# Patient Record
Sex: Female | Born: 1970
Health system: Southern US, Community
[De-identification: ages and names within clinical notes are randomized; demographics above are authoritative.]

## PROBLEM LIST (undated history)

## (undated) DIAGNOSIS — Z8041 Family history of malignant neoplasm of ovary: Secondary | ICD-10-CM

## (undated) DIAGNOSIS — T7840XA Allergy, unspecified, initial encounter: Secondary | ICD-10-CM

## (undated) DIAGNOSIS — E079 Disorder of thyroid, unspecified: Secondary | ICD-10-CM

## (undated) DIAGNOSIS — Z803 Family history of malignant neoplasm of breast: Secondary | ICD-10-CM

## (undated) HISTORY — DX: Disorder of thyroid, unspecified: E07.9

## (undated) HISTORY — DX: Allergy, unspecified, initial encounter: T78.40XA

## (undated) HISTORY — DX: Family history of malignant neoplasm of ovary: Z80.41

## (undated) HISTORY — DX: Family history of malignant neoplasm of breast: Z80.3

---

## 2004-06-18 ENCOUNTER — Encounter: Admission: RE | Admit: 2004-06-18 | Discharge: 2004-06-18 | Payer: Self-pay | Admitting: Obstetrics and Gynecology

## 2005-10-18 ENCOUNTER — Other Ambulatory Visit: Admission: RE | Admit: 2005-10-18 | Discharge: 2005-10-18 | Payer: Self-pay | Admitting: Family Medicine

## 2006-04-12 HISTORY — PX: LIPOMA EXCISION: SHX5283

## 2006-11-07 ENCOUNTER — Other Ambulatory Visit: Admission: RE | Admit: 2006-11-07 | Discharge: 2006-11-07 | Payer: Self-pay | Admitting: Family Medicine

## 2008-02-22 ENCOUNTER — Other Ambulatory Visit: Admission: RE | Admit: 2008-02-22 | Discharge: 2008-02-22 | Payer: Self-pay | Admitting: Family Medicine

## 2009-03-05 ENCOUNTER — Other Ambulatory Visit: Admission: RE | Admit: 2009-03-05 | Discharge: 2009-03-05 | Payer: Self-pay | Admitting: Family Medicine

## 2010-06-11 ENCOUNTER — Ambulatory Visit (INDEPENDENT_AMBULATORY_CARE_PROVIDER_SITE_OTHER): Payer: BC Managed Care – PPO | Admitting: Family Medicine

## 2010-06-11 ENCOUNTER — Other Ambulatory Visit (HOSPITAL_COMMUNITY)
Admission: RE | Admit: 2010-06-11 | Discharge: 2010-06-11 | Disposition: A | Discharge status: Home / Self Care | Payer: BC Managed Care – PPO | Source: Ambulatory Visit | Attending: Family Medicine | Admitting: Family Medicine

## 2010-06-11 ENCOUNTER — Other Ambulatory Visit: Payer: Self-pay | Admitting: Family Medicine

## 2010-06-11 DIAGNOSIS — Z124 Encounter for screening for malignant neoplasm of cervix: Secondary | ICD-10-CM | POA: Insufficient documentation

## 2010-06-11 DIAGNOSIS — E559 Vitamin D deficiency, unspecified: Secondary | ICD-10-CM

## 2010-06-11 DIAGNOSIS — Z0000 Encounter for general adult medical examination without abnormal findings: Secondary | ICD-10-CM

## 2010-06-11 DIAGNOSIS — E039 Hypothyroidism, unspecified: Secondary | ICD-10-CM

## 2010-06-11 DIAGNOSIS — Z01419 Encounter for gynecological examination (general) (routine) without abnormal findings: Secondary | ICD-10-CM

## 2010-06-12 LAB — HM PAP SMEAR: HM Pap smear: NEGATIVE

## 2010-11-11 ENCOUNTER — Encounter: Payer: Self-pay | Admitting: Family Medicine

## 2010-11-12 ENCOUNTER — Ambulatory Visit: Payer: BC Managed Care – PPO | Admitting: Family Medicine

## 2010-11-19 ENCOUNTER — Ambulatory Visit: Payer: BC Managed Care – PPO | Admitting: Family Medicine

## 2010-12-31 ENCOUNTER — Other Ambulatory Visit (INDEPENDENT_AMBULATORY_CARE_PROVIDER_SITE_OTHER): Payer: BC Managed Care – PPO

## 2011-01-01 ENCOUNTER — Encounter: Payer: Self-pay | Admitting: Family Medicine

## 2011-01-01 ENCOUNTER — Telehealth: Payer: Self-pay | Admitting: *Deleted

## 2011-01-01 DIAGNOSIS — E559 Vitamin D deficiency, unspecified: Secondary | ICD-10-CM | POA: Insufficient documentation

## 2011-01-01 LAB — VITAMIN D 25 HYDROXY (VIT D DEFICIENCY, FRACTURES): Vit D, 25-Hydroxy: 19 ng/mL — ABNORMAL LOW (ref 30–89)

## 2011-01-01 NOTE — Telephone Encounter (Signed)
Left message for pt to return my call to go over labs.

## 2011-01-05 ENCOUNTER — Other Ambulatory Visit: Payer: Self-pay | Admitting: *Deleted

## 2011-01-05 ENCOUNTER — Telehealth: Payer: Self-pay | Admitting: *Deleted

## 2011-01-05 DIAGNOSIS — E559 Vitamin D deficiency, unspecified: Secondary | ICD-10-CM

## 2011-01-05 MED ORDER — VITAMIN D (ERGOCALCIFEROL) 1.25 MG (50000 UNIT) PO CAPS: 50000.0000 [IU] | ORAL_CAPSULE | ORAL | Status: DC

## 2011-01-05 NOTE — Telephone Encounter (Signed)
Spoke with patient she will take vitamin d 50,000iu q weekly for 12 weeks, followed by OTC vitamin d 2,000iu. She is scheduled for 05/14/11 @ 2:00pm for vitamin d level. Called in rx to [facility] [address]

## 2011-05-11 ENCOUNTER — Encounter: Payer: Self-pay | Admitting: Internal Medicine

## 2011-05-11 ENCOUNTER — Other Ambulatory Visit: Payer: Self-pay | Admitting: Family Medicine

## 2011-05-11 DIAGNOSIS — Z1231 Encounter for screening mammogram for malignant neoplasm of breast: Secondary | ICD-10-CM

## 2011-05-14 ENCOUNTER — Other Ambulatory Visit: Payer: BC Managed Care – PPO

## 2011-05-14 DIAGNOSIS — E559 Vitamin D deficiency, unspecified: Secondary | ICD-10-CM

## 2011-05-15 LAB — VITAMIN D 25 HYDROXY (VIT D DEFICIENCY, FRACTURES): Vit D, 25-Hydroxy: 21 ng/mL — ABNORMAL LOW (ref 30–89)

## 2011-06-15 ENCOUNTER — Ambulatory Visit
Admission: RE | Admit: 2011-06-15 | Discharge: 2011-06-15 | Disposition: A | Discharge status: Home / Self Care | Payer: BC Managed Care – PPO | Source: Ambulatory Visit | Attending: Family Medicine | Admitting: Family Medicine

## 2011-06-15 DIAGNOSIS — Z1231 Encounter for screening mammogram for malignant neoplasm of breast: Secondary | ICD-10-CM

## 2011-12-20 ENCOUNTER — Encounter: Payer: Self-pay | Admitting: Family Medicine

## 2011-12-20 ENCOUNTER — Ambulatory Visit (INDEPENDENT_AMBULATORY_CARE_PROVIDER_SITE_OTHER): Payer: BC Managed Care – PPO | Admitting: Family Medicine

## 2011-12-20 VITALS — BP 116/72 | HR 68 | Ht 61.0 in | Wt 129.0 lb

## 2011-12-20 DIAGNOSIS — M79605 Pain in left leg: Secondary | ICD-10-CM

## 2011-12-20 DIAGNOSIS — M79609 Pain in unspecified limb: Secondary | ICD-10-CM

## 2011-12-20 DIAGNOSIS — E559 Vitamin D deficiency, unspecified: Secondary | ICD-10-CM

## 2011-12-20 DIAGNOSIS — M25539 Pain in unspecified wrist: Secondary | ICD-10-CM

## 2011-12-20 DIAGNOSIS — M25531 Pain in right wrist: Secondary | ICD-10-CM

## 2011-12-20 MED ORDER — NAPROXEN 500 MG PO TABS: 500.0000 mg | ORAL_TABLET | Freq: Two times a day (BID) | ORAL | Status: DC

## 2011-12-20 NOTE — Progress Notes (Signed)
Chief Complaint  Patient presents with  . Leg Swelling    left leg swelling and pain, especially in the shin area. Pt declines flu vaccine.   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  Pain also radiates down the shin.  Hurts to sleep at night due to pain.  Denies numbness or tingling.  Pain is described as sharp and shooting, not a burning pain.  Aches and throbs at night, keeps her awake.  Sometimes she sees a bulging vein that is very tender.  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  There is tenderness to touch.  Denies injury, change in activity.  Hit her leg with edge of car door over the summer.  Had a dark area to medial calf for 2-3 months.  Massage helped it.  This current pain seems higher up, unrelated.  Has taken advil and uses ace wrap for compression. Compression helps temporarily.  Worse over the last week.    Didn't have the swelling over the summer, when she wasn't working.  Swelling seems to occur when she is working, possibly positional.  Sometimes she sits on top of her leg; tries to change position frequently.  Shins hurt after walking.  Using orthotic which helps a little bit.  She also is complaining of R anterior wrist pain.  She rests the wrist on the laptop.  She doesn't keep wrists elevated while typing, hers rest on the laptop.  She isn't sure if it could be related to the heat from the laptop, or the position.  Denies numbness/tingling or weakness into R hand or fingers.    Vitamin D deficiency--she completed Rx, but stopped taking OTC Vitamin D in June.  Feels like she is eating better.  No current outpatient prescriptions on file prior to visit.   No Known Allergies  ROS:  Denies fevers, headaches, dizziness, chest pain, shortness of breath, cough, URI symptoms, skin rashes, depression, other joint pains, GI complaints or other concerns. +7 pound weight  gain  PHYSICAL EXAM: BP 116/72  Pulse 68  Ht 5\' 1"  (1.549 m)  Wt 129 lb (58.514 kg)  BMI 24.37 kg/m2  LMP 11/18/2011 Well developed, pleasant female in no distress Wrist: Mild +Tinel on right  Negative phalen.  Normal strength and sensation.  LLE:  Mild tenderness at inferior portion of anserine bursa only.  Small portion of vein is mildly tender.  No significant knot or distended vein visible.  No edema.  Megan Torres is nontender. Negative homan.  No calf swelling or tenderness.  2+ pulses.  Normal strength, sensation, reflexes.  ASSESSMENT/PLAN: 1. Leg pain, left   2. Unspecified vitamin D deficiency   3. Wrist pain, right    Possible symptomatic varicose vein vs mild superficial thrombophlebitis vs mild tendinitis.  Also has component of shin splints.  Sounds like she had hematoma from injury over the summer, which has resolved.  Mild tendinitis vs CTS on right.  Discussed compression stockings (recommend thigh high due to proximal location of symptoms in lower leg). Trial of NSAIDs.  Stop Advil.  Rx naproxen 500mg  BID x 10-14 days.  Risks/side effects reviewed. Take it with food, and cut back on the dose (1/2 tablet) if stomach is bothered. Use heat to affected areas of leg and wrist. Keep leg elevated when possible when having swelling and pain.   Recommended wrist brace (OTC)  Vitamin D deficiency--recommended restarting Vitamin  D 1000 IU daily.  Recheck at CPE.   Given weight gain, will also check TSH at CPE, unless recently checked by Dr. Lucianne Muss.  Will need TdaP at her CPE (last received when immigrated in 2003)

## 2011-12-20 NOTE — Patient Instructions (Addendum)
compression stockings (recommend thigh high due to proximal location of symptoms in lower leg). You can get these at medical supply store [employer] is on [address] near [place]; there is another store on [address] near [employer]).   Stop Advil.  Take naproxen 500mg  twice daily with food x 10-14 days Take it with food, and cut back on the dose (1/2 tablet) if stomach is bothered. Use heat to affected areas of leg and wrist. Consider wearing wrist brace (available at all pharmacies, for carpal tunnel) while working on the computer.  Keep leg elevated when possible when having swelling and pain.    Return for re-evaluation in 2 weeks if not improved.  Restart Vitamin D 1000 IU daily.  We will plan to recheck level at your physical.

## 2011-12-22 ENCOUNTER — Encounter: Payer: Self-pay | Admitting: Family Medicine

## 2012-02-09 ENCOUNTER — Encounter: Payer: BC Managed Care – PPO | Admitting: Family Medicine

## 2012-04-13 ENCOUNTER — Other Ambulatory Visit: Payer: BC Managed Care – PPO

## 2012-04-17 ENCOUNTER — Encounter: Payer: BC Managed Care – PPO | Admitting: Family Medicine

## 2012-04-20 ENCOUNTER — Encounter: Payer: Self-pay | Admitting: Family Medicine

## 2012-04-20 ENCOUNTER — Ambulatory Visit (INDEPENDENT_AMBULATORY_CARE_PROVIDER_SITE_OTHER): Payer: BC Managed Care – PPO | Admitting: Family Medicine

## 2012-04-20 VITALS — BP 120/78 | HR 72 | Ht 60.0 in | Wt 132.0 lb

## 2012-04-20 DIAGNOSIS — M25569 Pain in unspecified knee: Secondary | ICD-10-CM

## 2012-04-20 DIAGNOSIS — E039 Hypothyroidism, unspecified: Secondary | ICD-10-CM

## 2012-04-20 DIAGNOSIS — E559 Vitamin D deficiency, unspecified: Secondary | ICD-10-CM

## 2012-04-20 DIAGNOSIS — M25539 Pain in unspecified wrist: Secondary | ICD-10-CM

## 2012-04-20 DIAGNOSIS — M25531 Pain in right wrist: Secondary | ICD-10-CM

## 2012-04-20 DIAGNOSIS — M25562 Pain in left knee: Secondary | ICD-10-CM

## 2012-04-20 LAB — TSH: TSH: 3.092 u[IU]/mL (ref 0.350–4.500)

## 2012-04-20 MED ORDER — CELECOXIB 200 MG PO CAPS: 200.0000 mg | ORAL_CAPSULE | Freq: Every day | ORAL | Status: DC

## 2012-04-20 NOTE — Patient Instructions (Addendum)
Try and use the wrist brace when pain is flaring, wrist is swelling. Take celebrex once daily.  If you don't tolerate this medication, then you can try taking just 1/2 the naproxen twice daily with food (med that was previously prescribed--you might tolerate it better at lower dose).  If you have ongoing problems with wrist pain, we can check an x-ray, and refer you do hand doctor.  Try using moist heat to area of pain at lower leg, and ice and/or heat to knee (apply ice if swollen after exercise, otherwise heat can help).

## 2012-04-20 NOTE — Progress Notes (Signed)
Chief Complaint  Patient presents with  . Wrist Pain    right wrist pain and left knee pain x 6 months. Would like you to check her thyroid, she states she has a pain in the right side of her neck.   R wrist pain.  She notices a swelling/lump, that periodically hurts.  It is less painful now than when she originally called.  She used a wrist brace, and pain improves when she uses it.  The area swells when she uses it a lot. Pain radiates up the whole arm when she is using it a lot. Previously evaluated by me for this in September, and felt to have CTS vs tendinitis (didn't have this focal tenderness on dorsum of wrist at that time though)..  rx'd naprosyn, which bothered her stomach, so she stopped it.  L knee pain.  Pain at her left inferomedial knee area when she is sitting. Pain also radiates down the shin. Hurts to sleep at night sometimes, due to pain. Denies numbness or tingling. Pain is described as sharp and shooting, not a burning pain. Aches and throbs at night, keeps her awake.   Painful vein in lower left leg--not sure if she could have had any trauma to the area, but has had ongoing pain over that vein for 2 weeks.  Started around the same time she noticed the inferomedial knee area swelling again.  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  She is exercising on bike and doing yoga regularly.  Vitamin D deficiency and hypothyroidism--she wants vitamin D re-checked, and would like her thyroid checked with blood draw.  She is normally followed by Dr. Lucianne Muss.  Has noticed some increase in her weight, and some right sided anterior neck pain. Her vitamin D-OH was low at 21 back in 05/2011.  Has been taking supplement regularly since September.  Past Medical History  Diagnosis Date  . Vitamin D deficiency 2010  . Family history of breast cancer in mother   . Thyroid disease     HYPOTHYROID, h/o hyperthyroidism.  off meds  since 8/09  (Dr. Lucianne Muss)   Past Surgical History  Procedure Date  . Lipoma excision 1/08    fatty tumor removed from L axilla (in [place])   History   Social History  . Marital Status: Unknown    Spouse Name: N/A    Number of Children: N/A  . Years of Education: N/A   Occupational History  . Not on file.   Social History Main Topics  . Smoking status: Never Smoker   . Smokeless tobacco: Not on file  . Alcohol Use: Yes     Comment: 1/2-1 glass of wine every 3 months.  . Drug Use: No  . Sexually Active: Not on file   Other Topics Concern  . Not on file   Social History Narrative   Immigrated from [place] in 2003. Divorced and re-married.  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   No Known Allergies  ROS:  Denies fevers, headaches, dizziness, chest pain, shortness of breath, URI symptoms, nausea, vomiting, diarrhea, skin rash, or other concerns except as per HPI  PHYSICAL EXAM: BP 120/78  Pulse 72  Ht 5' (1.524 m)  Wt 132 lb (59.875 kg)  BMI 25.78 kg/m2  LMP 04/19/2012 Well developed, pleasant female in no distress R wrist--FROM, but painful.  Focal tender area on  dorsum of wrist over bone--not particularly swollen (symmetric with left).  No warmth or erythema.  Negative tinel and phalen testing.  Tender over L anserine bursa, minimal swelling, no warmth. No crepitus.  No pain with varus/valgus stress.  Normal lachman, mcmurray.  She has mild bulging and tenderness of vein in anterior lower shin area.  There is no thrombosis/cord and only mildly tender  ASSESSMENT/PLAN: 1. Wrist pain, right  celecoxib (CELEBREX) 200 MG capsule  2. Unspecified hypothyroidism  TSH  3. Unspecified vitamin D deficiency  Vitamin D 25 hydroxy  4. Left knee pain  celecoxib (CELEBREX) 200 MG capsule   R wrist pain.  It appears that the CTS has resolved (she made changes in positioning with typing).  Now has either tendinitis (due to  radiation of pain up the arm) vs ganglion cyst.  Discussed differential of both, and treatments.  Recommended periodic use of wrist brace, and intermittent courses of NSAIDs.  Didn't tolerate naproxyn in past. Samples of Celebrex given.  If doesn't tolerate, can try 1/2 tablet of previously rx'd naproxen.  If persistent/ongoing pain/swelling, check xray and refer to Dr. Amanda Pea or Dr. Teressa Senter  Knee pain--likely related to tendinitis/bursitis, and celebrex should help.  Ice vs heat reviewed. Area of pain in lower shin--contusion vs mild superficial thrombophlebitis.  Celebrex and warm compresses recommended.  TSH and vitamin D-OH.  Send results to Dr. Lucianne Muss

## 2012-04-21 LAB — VITAMIN D 25 HYDROXY (VIT D DEFICIENCY, FRACTURES): Vit D, 25-Hydroxy: 22 ng/mL — ABNORMAL LOW (ref 30–89)

## 2012-04-24 ENCOUNTER — Other Ambulatory Visit: Payer: Self-pay | Admitting: *Deleted

## 2012-04-24 MED ORDER — ERGOCALCIFEROL 1.25 MG (50000 UT) PO CAPS: 50000.0000 [IU] | ORAL_CAPSULE | ORAL | Status: DC

## 2012-05-16 ENCOUNTER — Other Ambulatory Visit: Payer: Self-pay | Admitting: Family Medicine

## 2012-05-16 DIAGNOSIS — Z1231 Encounter for screening mammogram for malignant neoplasm of breast: Secondary | ICD-10-CM

## 2012-06-01 ENCOUNTER — Encounter: Payer: BC Managed Care – PPO | Admitting: Family Medicine

## 2012-06-08 ENCOUNTER — Ambulatory Visit (INDEPENDENT_AMBULATORY_CARE_PROVIDER_SITE_OTHER): Payer: BC Managed Care – PPO | Admitting: Family Medicine

## 2012-06-08 ENCOUNTER — Encounter: Payer: Self-pay | Admitting: Family Medicine

## 2012-06-08 VITALS — BP 110/70 | HR 68 | Ht 60.0 in | Wt 134.0 lb

## 2012-06-08 DIAGNOSIS — Z0000 Encounter for general adult medical examination without abnormal findings: Secondary | ICD-10-CM

## 2012-06-08 DIAGNOSIS — Z23 Encounter for immunization: Secondary | ICD-10-CM

## 2012-06-08 DIAGNOSIS — Z79899 Other long term (current) drug therapy: Secondary | ICD-10-CM

## 2012-06-08 DIAGNOSIS — E559 Vitamin D deficiency, unspecified: Secondary | ICD-10-CM

## 2012-06-08 DIAGNOSIS — E039 Hypothyroidism, unspecified: Secondary | ICD-10-CM

## 2012-06-08 LAB — POCT URINALYSIS DIPSTICK
Bilirubin, UA: NEGATIVE
Blood, UA: NEGATIVE
Glucose, UA: NEGATIVE
Ketones, UA: NEGATIVE
Leukocytes, UA: NEGATIVE
Nitrite, UA: NEGATIVE
Protein, UA: NEGATIVE
Spec Grav, UA: 1.01
Urobilinogen, UA: NEGATIVE
pH, UA: 6

## 2012-06-08 NOTE — Patient Instructions (Signed)

## 2012-06-08 NOTE — Progress Notes (Signed)
Chief Complaint  Patient presents with  . Annual Exam    nonfasting annual exam with pap. No major concerns. Is supposed to be taking rx vitamin D x 4 more weeks, she did not know that there was a refill, she will call and get refill. She is taking OTC vitamin 2,000iu.   Megan Torres is a 42 y.o. female who presents for a complete physical.  She has the following concerns:  No longer having any wrist pain (took just a few Celebrex tablet and it resolved).  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  Has been using a cycling machine at home which really seems to help keep the knee discomfort at bay.  Teaching 8 classes, working lots of hours, and not sleeping very much, and she feels like that is contributing to weight gain, as her eating hasn't changed.   Immunization History  Administered Date(s) Administered  . Influenza, Seasonal, Injecte, Preservative Fre 06/08/2012  last tetanus 2003 when she immigrated Last Pap smear: 2012 Last mammogram: 06/2011, scheduled for next week Last colonoscopy: never Last DEXA: never Dentist: every 3 months Ophtho: yearly Exercise:  Exercise bike daily 40-60 minutes, gym on weekends for 1.5 hours  Past Medical History  Diagnosis Date  . Vitamin D deficiency 2010  . Family history of breast cancer in mother   . Thyroid disease     HYPOTHYROID, h/o hyperthyroidism.  off meds since 8/09  (Dr. Lucianne Muss)    Past Surgical History  Procedure Laterality Date  . Lipoma excision  1/08    fatty tumor removed from L axilla (in [place])    History   Social History  . Marital Status: Unknown    Spouse Name: N/A    Number of Children: N/A  . Years of Education: N/A   Occupational History  . Not on file.   Social History Main Topics  . Smoking status: Never Smoker   . Smokeless tobacco: Never Used  . Alcohol Use: Yes     Comment: 1/2-1 glass of wine every 3 months.  . Drug Use: No  .  Sexually Active: Yes -- Female partner(s)    Birth Control/ Protection: Condom   Other Topics Concern  . Not on file   Social History Narrative   Immigrated from [place] in 2003. Divorced and re-married.  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 Breast cancer Mother 54  . Diabetes Neg Hx   . Heart disease Neg Hx     Current outpatient prescriptions:Cholecalciferol (VITAMIN D) 2000 UNITS tablet, Take 2,000 Units by mouth daily., Disp: , Rfl: ;  celecoxib (CELEBREX) 200 MG capsule, Take 1 capsule (200 mg total) by mouth daily., Disp: 12 capsule, Rfl: 0;  ergocalciferol (VITAMIN D2) 50000 UNITS capsule, Take 50,000 Units by mouth once a week., Disp: , Rfl:   No Known Allergies  ROS:  The patient denies anorexia, fever, headaches,  vision changes, decreased hearing, ear pain, sore throat, breast concerns, chest pain, palpitations, dizziness, syncope, dyspnea on exertion, cough, swelling, nausea, vomiting, diarrhea, constipation, abdominal pain, melena, hematochezia, indigestion/heartburn, hematuria, incontinence, dysuria, irregular menstrual cycles, vaginal discharge, odor or itch, genital lesions, joint pains, numbness, tingling, weakness, tremor, suspicious skin lesions, depression, anxiety, abnormal bleeding/bruising, or enlarged lymph nodes. +weight gain (13 pounds in 2 years, 5 pounds in the last 6 months).  She sees Dr. Lucianne Muss for her  thyroid, last TSH was 3 (done here and sent to him) Occasional L knee pain with certain positions. She is working a lot of ours, not sleeping very much  PHYSICAL EXAM: BP 110/70  Pulse 68  Ht 5' (1.524 m)  Wt 134 lb (60.782 kg)  BMI 26.17 kg/m2  LMP 05/20/2012  General Appearance:    Alert, cooperative, no distress, appears stated age  Head:    Normocephalic, without obvious abnormality, atraumatic  Eyes:    PERRL, conjunctiva/corneas clear, EOM's intact, fundi    benign  Ears:    Normal TM's and external ear  canals  Nose:   Nares normal, mucosa normal, no drainage or sinus   tenderness  Throat:   Lips, mucosa, and tongue normal; teeth and gums normal  Neck:   Supple, no lymphadenopathy;  thyroid:  no   enlargement/tenderness/nodules; no carotid   bruit or JVD  Back:    Spine nontender, no curvature, ROM normal, no CVA     tenderness  Lungs:     Clear to auscultation bilaterally without wheezes, rales or     ronchi; respirations unlabored  Chest Wall:    No tenderness or deformity   Heart:    Regular rate and rhythm, S1 and S2 normal, no murmur, rub   or gallop  Breast Exam:    No masses, or nipple discharge or inversion.      No axillary lymphadenopathy.  +breast tenderness laterally at L breast x few days, no focal masses   Abdomen:     Soft, non-tender, nondistended, normoactive bowel sounds,    no masses, no hepatosplenomegaly  Genitalia:    Normal external genitalia without lesions.  BUS and vagina normal--except pustule noted R labia.  No fluctuance or surrounding erythema; cervix without lesions, or cervical motion tenderness. No abnormal vaginal discharge.  Uterus and adnexa not enlarged, nontender, no masses.  Pap not performed  Rectal:    Not performed  Extremities:   No clubbing, cyanosis or edema  Pulses:   2+ and symmetric all extremities  Skin:   Skin color, texture, turgor normal, no rashes or lesions  Lymph nodes:   Cervical, supraclavicular, and axillary nodes normal  Neurologic:   CNII-XII intact, normal strength, sensation and gait; reflexes 2+ and symmetric throughout          Psych:   Normal mood, affect, hygiene and grooming.    ASSESSMENT/PLAN:  Routine general medical examination at a health care facility - Plan: Visual acuity screening, POCT Urinalysis Dipstick, CBC with Differential, Lipid panel, Comprehensive metabolic panel  Need for prophylactic vaccination and inoculation against influenza - Plan: Flu vaccine greater than or equal to 3yo preservative free  IM  Unspecified vitamin D deficiency - Plan: Vitamin D 25 hydroxy  Unspecified hypothyroidism  Encounter for long-term (current) use of other medications - Plan: CBC with Differential, Comprehensive metabolic panel, Vitamin D 25 hydroxy  Need for Tdap vaccination - Plan: Tdap vaccine greater than or equal to 7yo IM   Pap next year Warm compresses to small cyst R labia  Discussed monthly self breast exams and yearly mammograms after the age of 25; at least 30 minutes of aerobic activity at least 5 days/week; proper sunscreen use reviewed; healthy diet, including goals of calcium and vitamin D intake and alcohol recommendations (less than or equal to 1 drink/day) reviewed; regular seatbelt use; changing batteries in smoke detectors.  Immunization recommendations discussed.  Colonoscopy recommendations reviewed--age 29, sooner prn.  Tdap and flu shot  today  Return in 6 months for fasting labs: c-met, cbc, vitamin D-OH, lipid

## 2012-06-15 ENCOUNTER — Ambulatory Visit: Payer: BC Managed Care – PPO

## 2012-06-30 ENCOUNTER — Ambulatory Visit: Payer: BC Managed Care – PPO

## 2012-09-25 ENCOUNTER — Other Ambulatory Visit: Payer: Self-pay | Admitting: Family Medicine

## 2012-09-26 NOTE — Telephone Encounter (Signed)
This is NOT appropriate refill.  She was only supposed to take x 8 weeks, prescribed back in January.  Supposed to be taking 2000 IU of OTC Vitamin D daily, and is due for recheck in August

## 2012-09-26 NOTE — Telephone Encounter (Signed)
Is this ok?

## 2012-11-20 ENCOUNTER — Other Ambulatory Visit: Payer: BC Managed Care – PPO

## 2012-11-20 DIAGNOSIS — Z0000 Encounter for general adult medical examination without abnormal findings: Secondary | ICD-10-CM

## 2012-11-20 DIAGNOSIS — E559 Vitamin D deficiency, unspecified: Secondary | ICD-10-CM

## 2012-11-20 DIAGNOSIS — Z79899 Other long term (current) drug therapy: Secondary | ICD-10-CM

## 2012-11-20 LAB — CBC WITH DIFFERENTIAL/PLATELET
Basophils Absolute: 0 10*3/uL (ref 0.0–0.1)
Basophils Relative: 0 % (ref 0–1)
Eosinophils Absolute: 0.2 10*3/uL (ref 0.0–0.7)
Eosinophils Relative: 3 % (ref 0–5)
HCT: 36.4 % (ref 36.0–46.0)
Hemoglobin: 12.1 g/dL (ref 12.0–15.0)
Lymphocytes Relative: 29 % (ref 12–46)
Lymphs Abs: 1.9 10*3/uL (ref 0.7–4.0)
MCH: 28.1 pg (ref 26.0–34.0)
MCHC: 33.2 g/dL (ref 30.0–36.0)
MCV: 84.5 fL (ref 78.0–100.0)
Monocytes Absolute: 0.5 10*3/uL (ref 0.1–1.0)
Monocytes Relative: 8 % (ref 3–12)
Neutro Abs: 3.8 10*3/uL (ref 1.7–7.7)
Neutrophils Relative %: 60 % (ref 43–77)
Platelets: 280 10*3/uL (ref 150–400)
RBC: 4.31 MIL/uL (ref 3.87–5.11)
RDW: 13.6 % (ref 11.5–15.5)
WBC: 6.4 10*3/uL (ref 4.0–10.5)

## 2012-11-20 LAB — LIPID PANEL
Cholesterol: 168 mg/dL (ref 0–200)
HDL: 35 mg/dL — ABNORMAL LOW (ref >39)
LDL Cholesterol: 97 mg/dL (ref 0–99)
Total CHOL/HDL Ratio: 4.8 Ratio
Triglycerides: 178 mg/dL — ABNORMAL HIGH (ref <150)
VLDL: 36 mg/dL (ref 0–40)

## 2012-11-20 LAB — COMPREHENSIVE METABOLIC PANEL
ALT: 9 U/L (ref 0–35)
AST: 13 U/L (ref 0–37)
Albumin: 4.1 g/dL (ref 3.5–5.2)
Alkaline Phosphatase: 57 U/L (ref 39–117)
BUN: 8 mg/dL (ref 6–23)
CO2: 24 mEq/L (ref 19–32)
Calcium: 9.1 mg/dL (ref 8.4–10.5)
Chloride: 104 mEq/L (ref 96–112)
Creat: 0.57 mg/dL (ref 0.50–1.10)
Glucose, Bld: 99 mg/dL (ref 70–99)
Potassium: 4.4 mEq/L (ref 3.5–5.3)
Sodium: 135 mEq/L (ref 135–145)
Total Bilirubin: 0.4 mg/dL (ref 0.3–1.2)
Total Protein: 6.7 g/dL (ref 6.0–8.3)

## 2012-11-21 ENCOUNTER — Encounter: Payer: Self-pay | Admitting: Family Medicine

## 2012-11-21 LAB — VITAMIN D 25 HYDROXY (VIT D DEFICIENCY, FRACTURES): Vit D, 25-Hydroxy: 30 ng/mL (ref 30–89)

## 2012-12-28 ENCOUNTER — Ambulatory Visit: Payer: BC Managed Care – PPO

## 2012-12-29 ENCOUNTER — Ambulatory Visit: Payer: BC Managed Care – PPO

## 2013-01-22 ENCOUNTER — Ambulatory Visit
Admission: RE | Admit: 2013-01-22 | Discharge: 2013-01-22 | Disposition: A | Discharge status: Home / Self Care | Payer: BC Managed Care – PPO | Source: Ambulatory Visit | Attending: Family Medicine | Admitting: Family Medicine

## 2013-01-22 DIAGNOSIS — Z1231 Encounter for screening mammogram for malignant neoplasm of breast: Secondary | ICD-10-CM

## 2014-02-22 ENCOUNTER — Encounter: Payer: Self-pay | Admitting: Internal Medicine

## 2014-03-29 ENCOUNTER — Other Ambulatory Visit: Payer: Self-pay

## 2014-03-29 DIAGNOSIS — Z1231 Encounter for screening mammogram for malignant neoplasm of breast: Secondary | ICD-10-CM

## 2014-04-04 ENCOUNTER — Ambulatory Visit
Admission: RE | Admit: 2014-04-04 | Discharge: 2014-04-04 | Disposition: A | Discharge status: Home / Self Care | Payer: PRIVATE HEALTH INSURANCE | Source: Ambulatory Visit

## 2014-04-04 DIAGNOSIS — Z1231 Encounter for screening mammogram for malignant neoplasm of breast: Secondary | ICD-10-CM

## 2014-08-05 ENCOUNTER — Ambulatory Visit (INDEPENDENT_AMBULATORY_CARE_PROVIDER_SITE_OTHER): Payer: PRIVATE HEALTH INSURANCE | Admitting: Family Medicine

## 2014-08-05 ENCOUNTER — Encounter: Payer: Self-pay | Admitting: Family Medicine

## 2014-08-05 VITALS — BP 124/64 | HR 68 | Ht 60.5 in | Wt 129.4 lb

## 2014-08-05 DIAGNOSIS — R5383 Other fatigue: Secondary | ICD-10-CM | POA: Diagnosis not present

## 2014-08-05 DIAGNOSIS — E039 Hypothyroidism, unspecified: Secondary | ICD-10-CM

## 2014-08-05 DIAGNOSIS — Z1159 Encounter for screening for other viral diseases: Secondary | ICD-10-CM | POA: Diagnosis not present

## 2014-08-05 DIAGNOSIS — Z3169 Encounter for other general counseling and advice on procreation: Secondary | ICD-10-CM

## 2014-08-05 DIAGNOSIS — E559 Vitamin D deficiency, unspecified: Secondary | ICD-10-CM

## 2014-08-05 LAB — COMPREHENSIVE METABOLIC PANEL
ALT: 15 U/L (ref 0–35)
AST: 17 U/L (ref 0–37)
Albumin: 4.5 g/dL (ref 3.5–5.2)
Alkaline Phosphatase: 55 U/L (ref 39–117)
BUN: 7 mg/dL (ref 6–23)
CO2: 28 mEq/L (ref 19–32)
Calcium: 9.8 mg/dL (ref 8.4–10.5)
Chloride: 104 mEq/L (ref 96–112)
Creat: 0.54 mg/dL (ref 0.50–1.10)
Glucose, Bld: 115 mg/dL — ABNORMAL HIGH (ref 70–99)
Potassium: 4.5 mEq/L (ref 3.5–5.3)
Sodium: 139 mEq/L (ref 135–145)
Total Bilirubin: 0.4 mg/dL (ref 0.2–1.2)
Total Protein: 7.4 g/dL (ref 6.0–8.3)

## 2014-08-05 LAB — CBC WITH DIFFERENTIAL/PLATELET
Basophils Absolute: 0 10*3/uL (ref 0.0–0.1)
Basophils Relative: 0 % (ref 0–1)
Eosinophils Absolute: 0.1 10*3/uL (ref 0.0–0.7)
Eosinophils Relative: 2 % (ref 0–5)
HCT: 39 % (ref 36.0–46.0)
Hemoglobin: 12.8 g/dL (ref 12.0–15.0)
Lymphocytes Relative: 29 % (ref 12–46)
Lymphs Abs: 2 10*3/uL (ref 0.7–4.0)
MCH: 27.9 pg (ref 26.0–34.0)
MCHC: 32.8 g/dL (ref 30.0–36.0)
MCV: 85 fL (ref 78.0–100.0)
MPV: 10.9 fL (ref 8.6–12.4)
Monocytes Absolute: 0.4 10*3/uL (ref 0.1–1.0)
Monocytes Relative: 6 % (ref 3–12)
Neutro Abs: 4.3 10*3/uL (ref 1.7–7.7)
Neutrophils Relative %: 63 % (ref 43–77)
Platelets: 264 10*3/uL (ref 150–400)
RBC: 4.59 MIL/uL (ref 3.87–5.11)
RDW: 13.1 % (ref 11.5–15.5)
WBC: 6.8 10*3/uL (ref 4.0–10.5)

## 2014-08-05 NOTE — Patient Instructions (Signed)
Change your multivitamin to a Pre-natal vitamin (for the higher dose of folic acid). Keep a calendar of your cycles--midcycle is when one normally ovulates; consider ovulation predictor kit.   Dr. Delila Pereyra is the name of an OB-GYN that does fertility evaluations (at least I know he used to).  If you are having trouble getting pregnant, call his office (and if he no longer does, they should have recommendations of other doctors).

## 2014-08-05 NOTE — Progress Notes (Signed)
Chief Complaint  Patient presents with  . Advice Only    wants to discuss getting pregnancy.    Patient presents to discuss getting pregnant.  She has never been pregnant before.  She states that her husband has finally decided he is ready for children.  She reports that they tried for pregnancy for 6 months last year--she doesn't think husband was "fully into it", not really ready at that time.  They haven't tried in the last 6 months, but hasn't used contraception either (doing nothing to prevent pregnancy). She wants to make sure she is healthy to become pregnant.  She hasn't had a physical in a couple of years (due to job/insurance change).  Her periods are regular.  Vitamin D deficiency---has been taking 2000 IU of OTC Vitamin D very regularly. She would like to have this rechecked today.  Hypothyroidism--has been off medications for over 5-6 years, no longer sees Dr. Dwyane Dee.  Denies any significant change in energy, weight, hair/skin/bowels.  She gets regular exercise, walking a lot, yoga 15-20 min walk on lunches at work, 3-4 mile walks on the weekends. Not getting other weight-bearing exercise.  Past Medical History  Diagnosis Date  . Vitamin D deficiency 2010  . Family history of breast cancer in mother   . Thyroid disease     HYPOTHYROID, h/o hyperthyroidism.  off meds since 8/09  (Dr. Dwyane Dee)   Past Surgical History  Procedure Laterality Date  . Lipoma excision  1/08    fatty tumor removed from L axilla (in [place])   History   Social History  . Marital Status: Married    Spouse Name: N/A  . Number of Children: N/A  . Years of Education: N/A   Occupational History  . Not on file.   Social History Main Topics  . Smoking status: Never Smoker   . Smokeless tobacco: Never Used  . Alcohol Use: Yes     Comment: 1/2-1 glass of wine every 3 months.  . Drug Use: No  . Sexual Activity:    Partners: Male    Birth Control/ Protection: Condom   Other Topics Concern  . Not  on file   Social History Narrative   Immigrated from [place] in 2003. Divorced and re-married.  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   Family History  Problem Relation Age of Onset  . Breast cancer Mother 40  . Diabetes Neg Hx   . Heart disease Neg Hx    Outpatient Encounter Prescriptions as of 08/05/2014  Medication Sig  . Cholecalciferol (VITAMIN D) 2000 UNITS tablet Take 2,000 Units by mouth daily.  . [DISCONTINUED] celecoxib (CELEBREX) 200 MG capsule Take 1 capsule (200 mg total) by mouth daily.  . [DISCONTINUED] ergocalciferol (VITAMIN D2) 50000 UNITS capsule Take 50,000 Units by mouth once a week.   No Known Allergies  ROS:  No fevers, chills, URI symptoms, weight changes, GI or GU complaints, bleeding, bruising, rash, joint pains, depression, anxiety (just stress from work), chest pain, palpitations or other concerns.  PHYSICAL EXAM: BP 124/64 mmHg  Pulse 68  Ht 5' 0.5" (1.537 m)  Wt 129 lb 6.4 oz (58.695 kg)  BMI 24.85 kg/m2  LMP 07/22/2014  Well developed, well-appearing female in no distress HEENT: PERRL, EOMI, conjunctiva clear.  OP clear Neck: no lymphadenopathy, thyromegaly or mass Heart: regular rate and rhythm Lungs: clear bilaterally Abdomen: soft, nontender, no organomegaly or mass Extremities: no edema, normal pulses Neuro: alert and oriented, normal  cranial nerves, strength, gait Psych: normal mood, affect, hygiene and grooming  ASSESSMENT/PLAN:  Encounter for preconception consultation - counseled re: PNV/folic acid supplementation, healthy diet, immunizations. Possible infertility--may need further eval - Plan: Varicella zoster antibody, IgG  Hypothyroidism, unspecified hypothyroidism type - previously followed by Dr. Dwyane Dee; off meds since '12-2008.  Denies symptoms - Plan: TSH  Vitamin D deficiency - Plan: Vit D  25 hydroxy (rtn osteoporosis monitoring)  Other fatigue - Plan: Comprehensive metabolic  panel, CBC with Differential/Platelet, Vit D  25 hydroxy (rtn osteoporosis monitoring), TSH  Screening for viral disease - if not immune to varicella, recommend vaccination x 2 prior to pregnancy - Plan: Varicella zoster antibody, IgG   Change your multivitamin to a Pre-natal vitamin (for the higher dose of folic acid). Keep a calendar of your cycles--midcycle is when one normally ovulates; consider ovulation predictor kit.  Discussed infertility--may not want/need to wait a full year to have husband get evaluated (given that they tried for 6 months unsuccessfully, and advancing age).  Doesn't recall ever having chicken pox--check titer, and immunize if needed. TSH, Vitamin D and other routine labs today.  She is scheduled for CPE in July--she will come fasting then to have lipids rechecked.  25 min visit, more than 1/2 spent counseling.

## 2014-08-06 LAB — VITAMIN D 25 HYDROXY (VIT D DEFICIENCY, FRACTURES): Vit D, 25-Hydroxy: 17 ng/mL — ABNORMAL LOW (ref 30–100)

## 2014-08-06 LAB — VARICELLA ZOSTER ANTIBODY, IGG: Varicella IgG: 320.4 Index — ABNORMAL HIGH (ref <135.00)

## 2014-08-06 LAB — TSH: TSH: 4.15 u[IU]/mL (ref 0.350–4.500)

## 2014-08-07 ENCOUNTER — Encounter: Payer: Self-pay | Admitting: Family Medicine

## 2014-08-07 DIAGNOSIS — E559 Vitamin D deficiency, unspecified: Secondary | ICD-10-CM

## 2014-08-08 MED ORDER — VITAMIN D (ERGOCALCIFEROL) 1.25 MG (50000 UNIT) PO CAPS: 50000.0000 [IU] | ORAL_CAPSULE | ORAL | Status: DC

## 2014-10-28 ENCOUNTER — Ambulatory Visit (INDEPENDENT_AMBULATORY_CARE_PROVIDER_SITE_OTHER): Payer: PRIVATE HEALTH INSURANCE | Admitting: Family Medicine

## 2014-10-28 ENCOUNTER — Encounter: Payer: BC Managed Care – PPO | Admitting: Family Medicine

## 2014-10-28 ENCOUNTER — Encounter: Payer: Self-pay | Admitting: Family Medicine

## 2014-10-28 VITALS — BP 110/68 | HR 76 | Ht 60.25 in | Wt 133.6 lb

## 2014-10-28 DIAGNOSIS — Z0000 Encounter for general adult medical examination without abnormal findings: Secondary | ICD-10-CM | POA: Diagnosis not present

## 2014-10-28 DIAGNOSIS — E039 Hypothyroidism, unspecified: Secondary | ICD-10-CM

## 2014-10-28 DIAGNOSIS — E559 Vitamin D deficiency, unspecified: Secondary | ICD-10-CM

## 2014-10-28 DIAGNOSIS — R7309 Other abnormal glucose: Secondary | ICD-10-CM | POA: Diagnosis not present

## 2014-10-28 DIAGNOSIS — M25562 Pain in left knee: Secondary | ICD-10-CM | POA: Diagnosis not present

## 2014-10-28 LAB — HEMOGLOBIN A1C
Hgb A1c MFr Bld: 5.7 % — ABNORMAL HIGH (ref <5.7)
Mean Plasma Glucose: 117 mg/dL — ABNORMAL HIGH (ref <117)

## 2014-10-28 LAB — POCT URINALYSIS DIPSTICK
Bilirubin, UA: NEGATIVE
Blood, UA: NEGATIVE
Glucose, UA: NEGATIVE
Ketones, UA: NEGATIVE
Leukocytes, UA: NEGATIVE
Nitrite, UA: NEGATIVE
Protein, UA: NEGATIVE
Spec Grav, UA: 1.025
Urobilinogen, UA: NEGATIVE
pH, UA: 6

## 2014-10-28 NOTE — Progress Notes (Signed)
Chief Complaint  Patient presents with  . Annual Exam    nonfasting annual exam-no pap, already had done. Left shin is really bothering her, would like you to take a look at.    Megan Torres is a 44 y.o. female who presents for a complete physical.  She has the following concerns:  She gets pain in her left upper shin area. Hurts after walking for an hour, or with prolonged sitting.  She has pain just medial to and along the shin, in the upper and mid portion.  She sometimes also sees bulging in her left ankle when she walks a lot. This is the same pain that she has been having for a while, which she previously described as knee pain, but now localizes it more to the shin.  She has pain there daily.  She can feel the pain when walking, but otherwise only has soreness if she touches/presses on this area.  She had h/o injury in left mid-shin, below the area of discomfort, not sure if that has anything to do with it.   She recently saw GYN at [facility] (last week).  She brings in copies of bloodwork (see below).  She doesn't believe a pap smear was done (?told not needed?). She is trying for pregnancy.  Vitamin D deficiency--she was prescribed another 12 week course of weekly Vitamin D after last labs in April.  Hypothyroidism--off meds x 5-6 years. Last TSH was borderline. She had it repeated 7/12 at GYN and TSH was 4.779 and had + TPO antibodies.  She was referred back to see Dr. Lucianne Muss.  She has appointment scheduled for August. She was Rubella immune. She does have some decrease in energy, but wonders if it related more to moods, difficult decision to leave her job.   Immunization History  Administered Date(s) Administered  . Influenza Split 02/01/2014  . Influenza, Seasonal, Injecte, Preservative Fre 06/08/2012  . Tdap 06/08/2012   Last Pap smear: 06/2010 per our records.  Saw GYN last week Last mammogram: 03/2014 Last colonoscopy: never Last DEXA: never Dentist: every 3  months Ophtho: yearly Exercise: walks a lot (15-20 min walks on lunches at work, 3-4 mile walks on the weekends, yoga  Past Medical History  Diagnosis Date  . Vitamin D deficiency 2010  . Family history of breast cancer in mother   . Thyroid disease     HYPOTHYROID, h/o hyperthyroidism.  off meds since 8/09  (Dr. Lucianne Muss)    Past Surgical History  Procedure Laterality Date  . Lipoma excision  1/08    fatty tumor removed from L axilla (in [place])    History   Social History  . Marital Status: Married    Spouse Name: N/A  . Number of Children: N/A  . Years of Education: N/A   Occupational History  . Not on file.   Social History Main Topics  . Smoking status: Never Smoker   . Smokeless tobacco: Never Used  . Alcohol Use: 0.0 oz/week    0 Standard drinks or equivalent per week     Comment: 1/2-1 glass of wine every 3 months.  . Drug Use: No  . Sexual Activity:    Partners: Male    Birth Control/ Protection:    Other Topics Concern  . Not on file   Social History Narrative   Immigrated from [place] in 2003. Divorced and re-married.  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 Breast cancer Mother 70  . Diabetes Neg Hx   . Heart disease Neg Hx   . Ovarian cancer Maternal Aunt     Outpatient Encounter Prescriptions as of 10/28/2014  Medication Sig  . Cholecalciferol (VITAMIN D) 2000 UNITS tablet Take 2,000 Units by mouth daily.  . Prenatal Vit-Fe Fumarate-FA (MULTIVITAMIN-PRENATAL) 27-0.8 MG TABS tablet Take 1 tablet by mouth daily at 12 noon.  . [DISCONTINUED] Vitamin D, Ergocalciferol, (DRISDOL) 50000 UNITS CAPS capsule Take 1 capsule (50,000 Units total) by mouth every 7 (seven) days.   No facility-administered encounter medications on file as of 10/28/2014.    No Known Allergies   ROS: The patient denies anorexia, fever, headaches (just rare), vision  changes, decreased hearing, ear pain, sore throat, breast concerns, chest pain, palpitations, dizziness, syncope, dyspnea on exertion, cough, swelling, nausea, vomiting, diarrhea, constipation, abdominal pain, melena, hematochezia, indigestion/heartburn, hematuria, incontinence, dysuria, irregular menstrual cycles, vaginal discharge, odor or itch, genital lesions, joint pains, numbness, tingling, weakness, tremor, suspicious skin lesions, depression, anxiety, abnormal bleeding/bruising, or enlarged lymph nodes. Some days has low energy--intermittent.  Not sure if could be related to moods (felt bad, tired, "low" after recently quitting her job). +4# weight gain since her last visit 3 months ago.  PHYSICAL EXAM:  BP 110/68 mmHg  Pulse 76  Ht 5' 0.25" (1.53 m)  Wt 133 lb 9.6 oz (60.601 kg)  BMI 25.89 kg/m2  LMP 10/12/2014  General Appearance:   Alert, cooperative, no distress, appears stated age  Head:   Normocephalic, without obvious abnormality, atraumatic  Eyes:   PERRL, conjunctiva/corneas clear, EOM's intact, fundi   benign  Ears:   Normal TM's and external ear canals  Nose:  Nares normal, mucosa normal, no drainage or sinus tenderness  Throat:  Lips, mucosa, and tongue normal; teeth and gums normal  Neck:  Supple, no lymphadenopathy; thyroid: no enlargement/tenderness/nodules; no carotid  bruit or JVD  Back:  Spine nontender, no curvature, ROM normal, no CVA tenderness  Lungs:   Clear to auscultation bilaterally without wheezes, rales or ronchi; respirations unlabored  Chest Wall:   No tenderness or deformity  Heart:   Regular rate and rhythm, S1 and S2 normal, no murmur, rub  or gallop  Breast Exam:   deferred to GYN  Abdomen:   Soft, non-tender, nondistended, normoactive bowel sounds,   no masses, no hepatosplenomegaly  Genitalia:   deferred to GYN  Rectal:   deferred to GYN  Extremities:  No clubbing, cyanosis  or edema. Tender along anterior tibia on the left, and medial to this area as well.  Question of possible mild superficial varicosity along left shin (mid-tibia, below the most tender area).  WHSS mid-leg (most of discomfort is above this)  Pulses:  2+ and symmetric all extremities  Skin:  Skin color, texture, turgor normal, no rashes or lesions  Lymph nodes:  Cervical, supraclavicular, and axillary nodes normal  Neurologic:  CNII-XII intact, normal strength, sensation and gait; reflexes 2+ and symmetric throughout   Psych: Normal mood, affect, hygiene and grooming          Chemistry      Component Value Date/Time   NA 139 08/05/2014 0001   K 4.5 08/05/2014 0001   CL 104 08/05/2014 0001   CO2 28 08/05/2014 0001   BUN 7 08/05/2014 0001   CREATININE 0.54 08/05/2014 0001      Component Value Date/Time   CALCIUM 9.8 08/05/2014 0001  ALKPHOS 55 08/05/2014 0001   AST 17 08/05/2014 0001   ALT 15 08/05/2014 0001   BILITOT 0.4 08/05/2014 0001     Glucose 115 (nonfasting)  Lab Results  Component Value Date   WBC 6.8 08/05/2014   HGB 12.8 08/05/2014   HCT 39.0 08/05/2014   MCV 85.0 08/05/2014   PLT 264 08/05/2014   Lab Results  Component Value Date   TSH 4.150 08/05/2014   Vitamin D-OH 17  Varicella immune   ASSESSMENT/PLAN:  Annual physical exam - Plan: Visual acuity screening, Hemoglobin A1c, Vit D  25 hydroxy (rtn osteoporosis monitoring), POCT Urinalysis Dipstick  Vitamin D deficiency - recheck today, after 12 weeks of prescription replacement - Plan: Vit D  25 hydroxy (rtn osteoporosis monitoring)  Elevated glucose - (when nonfasting). She is nonfasting today so check A1c as diabetes screen. - Plan: Hemoglobin A1c  Pain in joint, lower leg, left - along anterior tibia, and into muscles. consider venous disesase as cause.  compression stockings, heat, elevation prn. check x-ray - Plan: DG Tibia/Fibula Left  Hypothyroidism,  unspecified hypothyroidism type - has been subclinical, off meds; now with rising TSH, fatigue and some weight gain.  f/u as scheduled with endo   X-ray of left tibia Compression stockings. Consider vascular eval if ongoing problems and x-ray normal  Recheck vitamin D today (now just completed 12 week course of prescription replacement). Check A1c today--hasn't had diabetes screen since 2014.  Last glucose was 115, nonfasting.   Check with your mother to see if she ever had BRCA gene testing (she can check with her oncologist).  If she was tested, and was NOT a carrier, then you are fine.  If she was positive for the genes, then you should consider getting tested (referral to genetic counseling).  Discussed monthly self breast exams and yearly mammograms after the age of 46; at least 30 minutes of aerobic activity at least 5 days/week, weight-bearing exercise 2x/wk; proper sunscreen use reviewed; healthy diet, including goals of calcium and vitamin D intake and alcohol recommendations (less than or equal to 1 drink/day) reviewed; regular seatbelt use; changing batteries in smoke detectors.  Immunization recommendations discussed, UTD.  Get flu shot in the fall.  Colonoscopy recommendations reviewed, age 50

## 2014-10-28 NOTE — Patient Instructions (Addendum)
  HEALTH MAINTENANCE RECOMMENDATIONS:  It is recommended that you get at least 30 minutes of aerobic exercise at least 5 days/week (for weight loss, you may need as much as 60-90 minutes). This can be any activity that gets your heart rate up. This can be divided in 10-15 minute intervals if needed, but try and build up your endurance at least once a week.  Weight bearing exercise is also recommended twice weekly.  Eat a healthy diet with lots of vegetables, fruits and fiber.  "Colorful" foods have a lot of vitamins (ie green vegetables, tomatoes, red peppers, etc).  Limit sweet tea, regular sodas and alcoholic beverages, all of which has a lot of calories and sugar.  Up to 1 alcoholic drink daily may be beneficial for women (unless trying to lose weight, watch sugars).  Drink a lot of water.  Calcium recommendations are 1200-1500 mg daily (1500 mg for postmenopausal women or women without ovaries), and vitamin D 1000 IU daily.  This should be obtained from diet and/or supplements (vitamins), and calcium should not be taken all at once, but in divided doses.  Monthly self breast exams and yearly mammograms for women over the age of 67 is recommended.  Sunscreen of at least SPF 30 should be used on all sun-exposed parts of the skin when outside between the hours of 10 am and 4 pm (not just when at beach or pool, but even with exercise, golf, tennis, and yard work!)  Use a sunscreen that says "broad spectrum" so it covers both UVA and UVB rays, and make sure to reapply every 1-2 hours.  Remember to change the batteries in your smoke detectors when changing your clock times in the spring and fall.  Use your seat belt every time you are in a car, and please drive safely and not be distracted with cell phones and texting while driving.   Check with your mother to see if she ever had BRCA gene testing (she can check with her oncologist).  If she was tested, and was NOT a carrier, then you are fine.  If  she was positive for the genes, then you should consider getting tested (referral to genetic counseling).   Go to [facility] (either [address]) for x-ray of the left lower leg. When not so hot, try wearing the compression stockings again. When very painful, elevated the leg and apply warm compress.  Your decrease in energy could be related to moods, but more likely related to thyroid.  Discuss this with Dr. Dwyane Dee when you see him.

## 2014-10-29 ENCOUNTER — Encounter: Payer: Self-pay | Admitting: Family Medicine

## 2014-10-29 LAB — VITAMIN D 25 HYDROXY (VIT D DEFICIENCY, FRACTURES): Vit D, 25-Hydroxy: 18 ng/mL — ABNORMAL LOW (ref 30–100)

## 2014-10-31 ENCOUNTER — Ambulatory Visit
Admission: RE | Admit: 2014-10-31 | Discharge: 2014-10-31 | Disposition: A | Discharge status: Home / Self Care | Payer: PRIVATE HEALTH INSURANCE | Source: Ambulatory Visit | Attending: Family Medicine | Admitting: Family Medicine

## 2014-10-31 DIAGNOSIS — M25562 Pain in left knee: Secondary | ICD-10-CM

## 2014-11-07 ENCOUNTER — Encounter: Payer: Self-pay | Admitting: Family Medicine

## 2014-12-02 ENCOUNTER — Encounter: Payer: Self-pay | Admitting: Endocrinology

## 2014-12-02 ENCOUNTER — Ambulatory Visit (INDEPENDENT_AMBULATORY_CARE_PROVIDER_SITE_OTHER): Payer: PRIVATE HEALTH INSURANCE | Admitting: Endocrinology

## 2014-12-02 VITALS — BP 112/80 | HR 77 | Temp 98.3°F | Wt 136.4 lb

## 2014-12-02 DIAGNOSIS — E038 Other specified hypothyroidism: Secondary | ICD-10-CM | POA: Diagnosis not present

## 2014-12-02 DIAGNOSIS — E063 Autoimmune thyroiditis: Secondary | ICD-10-CM

## 2014-12-02 MED ORDER — LEVOTHYROXINE SODIUM 25 MCG PO TABS: 25.0000 ug | ORAL_TABLET | Freq: Every day | ORAL | Status: DC

## 2014-12-02 NOTE — Patient Instructions (Signed)
Start 1/2 tab in am before breakfast

## 2014-12-02 NOTE — Progress Notes (Signed)
Pre visit review using our clinic review tool, if applicable. No additional management support is needed unless otherwise documented below in the visit note. 

## 2014-12-02 NOTE — Progress Notes (Signed)
Patient ID: Megan Torres, female   DOB: 1970-08-29, 44 y.o.   MRN: [id number]            Reason for Appointment:  Hypothyroidism, new visit    History of Present Illness:   The Hypothyroidism was first diagnosed in 2005   The symptoms consistent with hypothyroidism initially were fatigue, hair loss, infrequent menstrual cycles Her highest TSH was 17.1 However the year before this she had transient hyperthyroidism and also thyroid enlargement which resolved She had been treated with 100 g of levothyroxine for about 4 years  Subsequently her thyroid levels apparently stayed normal without any supplementation  She was seen in 2013 and at that time she was not on any thyroid supplements.  She had no symptoms of hypothyroidism and her TSH was 4.9 She had minimal thyroid enlargement Since she was relatively asymptomatic and was told to just follow-up annually without any supplements  RECENT history: She had a routine TSH test done in 07/2014 which was upper normal by her PCP  She was evaluated by her gynecologist because of infertility recently and her TSH was 4.8, normal less than 4.5. She also has a high peroxidase antibody titer Because of her TSH being minimally increase her gynecologist recommended that she start supplementation as it may improve her chances of conception At this time the patient is still not complaining of any significant fatigue, may occasionally get tired.  She has some symptoms of mood swings and decreased motivation Her appetite is quite variable She has gradually been gaining weight over the last few years and has gained about 9 pounds in the last 3 years    Lab Results  Component Value Date   TSH 4.150 08/05/2014   TSH 3.092 04/20/2012     Past Medical History  Diagnosis Date  . Vitamin D deficiency 2010  . Family history of breast cancer in mother   . Thyroid disease     HYPOTHYROID, h/o hyperthyroidism.  off meds since 8/09  (Dr. Dwyane Dee)     Past Surgical History  Procedure Laterality Date  . Lipoma excision  1/08    fatty tumor removed from L axilla (in [place])    Family History  Problem Relation Age of Onset  . Breast cancer Mother 27  . Hypothyroidism Mother   . Diabetes Neg Hx   . Heart disease Neg Hx   . Ovarian cancer Maternal Aunt     Social History:  reports that she has never smoked. She has never used smokeless tobacco. She reports that she drinks alcohol. She reports that she does not use illicit drugs.  Allergies: No Known Allergies    Medication List       This list is accurate as of: 12/02/14 11:59 PM.  Always use your most recent med list.               levothyroxine 25 MCG tablet  Commonly known as:  LEVOTHROID  Take 1 tablet (25 mcg total) by mouth daily before breakfast.     multivitamin-prenatal 27-0.8 MG Tabs tablet  Take 1 tablet by mouth daily at 12 noon.     Vitamin D (Ergocalciferol) 50000 UNITS Caps capsule  Commonly known as:  DRISDOL  TK 1 C PO EVERY 7 DAYS     Vitamin D 2000 UNITS tablet  Take 2,000 Units by mouth daily.        Review of Systems:  She has been started on supplementation for vitamin D deficiency and she  takes her vitamin D with food   She takes multivitamins at dinnertime            Examination:    BP 112/80 mmHg  Pulse 77  Temp(Src) 98.3 F (36.8 C) (Oral)  Wt 136 lb 6 oz (61.859 kg)  SpO2 97%  GENERAL:  Average build.  Looks well, pleasant and cooperative  No pallor, clubbing, lymphadenopathy or edema.  Skin:  no rash or pigmentation.  EYES:  No prominence of the eyes or swelling of the eyelids  ENT: Oral mucosa and tongue normal.  THYROID:  Not palpable.  HEART:  Normal  S1 and S2; no murmur or click.  CHEST:    Lungs: Vescicular breath sounds heard equally.  No crepitations/ wheeze.  ABDOMEN:  Exam not indicated  NEUROLOGICAL: Reflexes are bilaterally normal at biceps.  JOINTS:  Normal peripheral  joints.   Assessment:  Hashimoto's thyroiditis: She continues to be fairly asymptomatic over the last 7 years but she has not been on thyroid supplementation Her TSH level continues to be also stable in the upper normal or minimally increased range Also does not have a palpable goiter at this time Discussed autoimmune nature of Hashimoto's thyroiditis and that she has had a stable course so far, probably had initial thyroiditis causing transient hyperthyroidism and subsequent transient hypothyroidism also  Since she is desiring to conceive and her gynecologist would like to normalize her TSH level she would benefit from low-dose thyroid supplementation as a trial  Treatment:    Since her TSH is minimally increased will start her with 12.5 g of levothyroxine She will come back in about 6 weeks for follow-up for repeat thyroid levels and office visit   [name] 12/03/2014, 11:01 AM

## 2014-12-03 ENCOUNTER — Encounter: Payer: Self-pay | Admitting: Endocrinology

## 2015-01-09 ENCOUNTER — Other Ambulatory Visit (INDEPENDENT_AMBULATORY_CARE_PROVIDER_SITE_OTHER): Payer: PRIVATE HEALTH INSURANCE

## 2015-01-09 DIAGNOSIS — E063 Autoimmune thyroiditis: Secondary | ICD-10-CM

## 2015-01-09 DIAGNOSIS — E038 Other specified hypothyroidism: Secondary | ICD-10-CM

## 2015-01-09 LAB — TSH: TSH: 3.1 u[IU]/mL (ref 0.35–4.50)

## 2015-01-09 LAB — T4, FREE: Free T4: 0.84 ng/dL (ref 0.60–1.60)

## 2015-01-13 ENCOUNTER — Encounter: Payer: Self-pay | Admitting: Endocrinology

## 2015-01-13 ENCOUNTER — Ambulatory Visit: Payer: PRIVATE HEALTH INSURANCE | Admitting: Endocrinology

## 2015-01-13 ENCOUNTER — Ambulatory Visit (INDEPENDENT_AMBULATORY_CARE_PROVIDER_SITE_OTHER): Payer: PRIVATE HEALTH INSURANCE | Admitting: Endocrinology

## 2015-01-13 VITALS — BP 124/68 | HR 65 | Temp 97.9°F | Resp 14 | Ht 60.0 in | Wt 137.0 lb

## 2015-01-13 DIAGNOSIS — E038 Other specified hypothyroidism: Secondary | ICD-10-CM

## 2015-01-13 DIAGNOSIS — E063 Autoimmune thyroiditis: Secondary | ICD-10-CM

## 2015-01-13 NOTE — Patient Instructions (Signed)
Take full tablet in am

## 2015-01-13 NOTE — Progress Notes (Signed)
Patient ID: Megan Torres, female   DOB: 1970-06-16, 44 y.o.   MRN: [id number]            Reason for Appointment:  Hypothyroidism, follow-up visit    History of Present Illness:   The Hypothyroidism was first diagnosed in 2005   The symptoms consistent with hypothyroidism initially were fatigue, hair loss, infrequent menstrual cycles Her highest TSH was 17.1 However the year before this she had transient hyperthyroidism and also thyroid enlargement which resolved She had been treated with 100 g of levothyroxine for about 4 years  Subsequently her thyroid levels apparently stayed normal without any supplementation  She was seen in 2013 and at that time she was not on any thyroid supplements.  She had no symptoms of hypothyroidism and her TSH was 4.9 She had minimal thyroid enlargement Since she was relatively asymptomatic and was told to just follow-up annually without any supplements  RECENT history: She had a routine TSH test done in 07/2014 which was upper normal  She was evaluated by her gynecologist because of infertility later and her TSH was 4.8, normal less than 4.5. She also has a high peroxidase antibody titer Because of her TSH being minimally increase her gynecologist recommended that she start thyroxine supplementation as it may improve her chances of conception However she had not been complaining of any significant fatigue   She has gradually been gaining weight over the last few years and has gained about 9 pounds in the last 3 years  Since her TSH was high normal she was started on a trial of a half tablet of 25 g levothyroxine She does not feel any different with this Her appetite is quite variable but recently easier to control Weight is about the same  Her TSH is slightly better    Wt Readings from Last 3 Encounters:  01/13/15 137 lb (62.143 kg)  12/02/14 136 lb 6 oz (61.859 kg)  10/28/14 133 lb 9.6 oz (60.601 kg)   Lab Results  Component Value Date     TSH 3.10 01/09/2015   TSH 4.150 08/05/2014   TSH 3.092 04/20/2012   FREET4 0.84 01/09/2015      Past Medical History  Diagnosis Date  . Vitamin D deficiency 2010  . Family history of breast cancer in mother   . Thyroid disease     HYPOTHYROID, h/o hyperthyroidism.  off meds since 8/09  (Dr. Dwyane Dee)    Past Surgical History  Procedure Laterality Date  . Lipoma excision  1/08    fatty tumor removed from L axilla (in [place])    Family History  Problem Relation Age of Onset  . Breast cancer Mother 53  . Hypothyroidism Mother   . Diabetes Neg Hx   . Heart disease Neg Hx   . Ovarian cancer Maternal Aunt     Social History:  reports that she has never smoked. She has never used smokeless tobacco. She reports that she drinks alcohol. She reports that she does not use illicit drugs.  Allergies: No Known Allergies    Medication List       This list is accurate as of: 01/13/15  5:09 PM.  Always use your most recent med list.               levothyroxine 25 MCG tablet  Commonly known as:  LEVOTHROID  Take 1 tablet (25 mcg total) by mouth daily before breakfast.     multivitamin-prenatal 27-0.8 MG Tabs tablet  Take 1 tablet  by mouth daily at 12 noon.     Vitamin D (Ergocalciferol) 50000 UNITS Caps capsule  Commonly known as:  DRISDOL  TK 1 C PO EVERY 7 DAYS     Vitamin D 2000 UNITS tablet  Take 2,000 Units by mouth daily.        Review of Systems:  She has been treated for  vitamin D deficiency and she takes her vitamin D with food   She takes multivitamins at dinnertime            Examination:    BP 124/68 mmHg  Pulse 65  Temp(Src) 97.9 F (36.6 C)  Resp 14  Ht 5' (1.524 m)  Wt 137 lb (62.143 kg)  BMI 26.76 kg/m2  SpO2 97%   THYROID:  Not palpable.   Assessment:  Hashimoto's thyroiditis: She continues to be asymptomatic over the last 7 years  previously without thyroid supplementation Her TSH level had been stable in the upper normal or  minimally increased range Since she is desiring to conceive and her gynecologist would like to normalize her TSH level she is now on low-dose levothyroxine With 12.5 g her TSH is 3.1  Treatment:    Since her TSH is  still minimally increased will  increase her dose to 25 g and have her follow-up in 3 months   Megan Torres 01/13/2015, 5:09 PM

## 2015-04-11 ENCOUNTER — Other Ambulatory Visit: Payer: PRIVATE HEALTH INSURANCE

## 2015-04-15 ENCOUNTER — Ambulatory Visit: Payer: PRIVATE HEALTH INSURANCE | Admitting: Endocrinology

## 2015-06-05 ENCOUNTER — Other Ambulatory Visit: Payer: Self-pay

## 2015-06-05 DIAGNOSIS — Z1231 Encounter for screening mammogram for malignant neoplasm of breast: Secondary | ICD-10-CM

## 2015-06-09 ENCOUNTER — Ambulatory Visit
Admission: RE | Admit: 2015-06-09 | Discharge: 2015-06-09 | Disposition: A | Discharge status: Home / Self Care | Payer: PRIVATE HEALTH INSURANCE | Source: Ambulatory Visit

## 2015-06-09 DIAGNOSIS — Z1231 Encounter for screening mammogram for malignant neoplasm of breast: Secondary | ICD-10-CM

## 2015-06-18 ENCOUNTER — Ambulatory Visit: Payer: PRIVATE HEALTH INSURANCE

## 2015-08-16 ENCOUNTER — Other Ambulatory Visit: Payer: Self-pay | Admitting: Family Medicine

## 2016-04-23 ENCOUNTER — Other Ambulatory Visit: Payer: Self-pay | Admitting: Family Medicine

## 2016-04-23 DIAGNOSIS — Z1231 Encounter for screening mammogram for malignant neoplasm of breast: Secondary | ICD-10-CM

## 2016-06-09 ENCOUNTER — Ambulatory Visit
Admission: RE | Admit: 2016-06-09 | Discharge: 2016-06-09 | Disposition: A | Discharge status: Home / Self Care | Payer: PRIVATE HEALTH INSURANCE | Source: Ambulatory Visit | Attending: Family Medicine | Admitting: Family Medicine

## 2016-06-09 DIAGNOSIS — Z1231 Encounter for screening mammogram for malignant neoplasm of breast: Secondary | ICD-10-CM

## 2016-09-22 ENCOUNTER — Telehealth: Payer: Self-pay | Admitting: *Deleted

## 2016-09-22 NOTE — Telephone Encounter (Signed)
According to the forms, immunization records are due for 6/15. These are asking for childhood immunizations, of which we don't have a record.  She might be able to get these through another school that she attended. All we have is her TdaP 06/08/12. Records from infertility specialist reviewed--no immunizations or labs done to show immunity.  Advise pt that we do not have the info needed to fill out the immunization forms.  (her forms are in an outgoing red shoulder on my shelf in office)

## 2016-09-22 NOTE — Telephone Encounter (Signed)
Dropped off forms for grad school/vaccines. Needs to see if we can fill out-had fertility clinic fax [place] entire record (I went through it and didn't see any vaccines or titers) can you please check behind me and look at forms to see if they can be completed? I put them in your folder on your shelf, thanks.

## 2016-09-23 NOTE — Telephone Encounter (Signed)
Spoke to patient and she states that she does not have any records of her immunizations and can't get them from anywhere. She states she thinks she had stuff done back in 2003,2004 but not sure what and not sure where. I did see varicella zoster antibody was done back on 08/05/14 under labs. Pt wants to know what does she need to do about the immunizations then? Does she need to be seen for yall to discuss this?

## 2016-09-23 NOTE — Telephone Encounter (Signed)
Suggest checking with the school to see which ones are truly needed, that the childhood ones aren't accessible.  There is bloodwork for immunity for some, but not all, and see if they can say which are truly needed.  We can give a tetanus booster date, but not the primary series.  Usually these records come from any other school (college) that would have requested them, but if that is no longer accessible, I don't know how to get them.  Therefore, checking with the school is what I recommend.

## 2016-09-23 NOTE — Telephone Encounter (Signed)
Left message for pt to call back  °

## 2016-09-24 ENCOUNTER — Telehealth: Payer: Self-pay | Admitting: Internal Medicine

## 2016-09-24 DIAGNOSIS — Z0184 Encounter for antibody response examination: Secondary | ICD-10-CM

## 2016-09-24 NOTE — Telephone Encounter (Signed)
Pt was advised that she would need to talk to her school and find out what does she truly have to have. She remembers having dtap,polio and MMR as a child but not sure the date of it. Pt had Tdap back in 2014 so she is good on that and looks like pt will need to have hep b series based on section A of form that is required. She will talk to her school and get back to you on what needs to be done for school

## 2016-09-24 NOTE — Telephone Encounter (Signed)
Pt called back and states that she talked to the health center at school and she does have to have MMR immunity blood work done to see if she is immune and has to have 2 more of the TDAPs (not just the plain TD). She does not have to have hep b series due to her being born in the 24's. Please advise if this is ok for pt to get blood work done and 2 more tdap's. Pt asked how far apart does she have wait to get another tdap once she have the 2nd one.

## 2016-09-24 NOTE — Telephone Encounter (Signed)
[place] for MMR titers, please enter future orders and schedule lab visit.  She can have Tdap at the same visit. I'd have to check the schedule (or you can) to answer question about the next Tdap)--it should be on the chart hanging in your nurse's station, there should be a section on catch up immunizations. If you can't find this info, I can look with you on Monday when I'm back in the office. (but shouldn't hold her up from getting the first and labs)

## 2016-09-27 ENCOUNTER — Other Ambulatory Visit (INDEPENDENT_AMBULATORY_CARE_PROVIDER_SITE_OTHER): Payer: No Typology Code available for payment source

## 2016-09-27 DIAGNOSIS — Z23 Encounter for immunization: Secondary | ICD-10-CM | POA: Diagnosis not present

## 2016-09-27 DIAGNOSIS — Z0184 Encounter for antibody response examination: Secondary | ICD-10-CM

## 2016-09-27 NOTE — Telephone Encounter (Signed)
Pt will have her 2nd Tdap shot today and then in 6-12 months she will need her 3rd TDAP shot. Dr. Tomi Bamberger is aware of this and looked this over with me to make sure the recommendations of the tdap series

## 2016-09-27 NOTE — Telephone Encounter (Signed)
I have put future order in for MMR for pt to get immunity testing done. And get a tdap shot today. I will look and see when the pt has to have 3rd tdap after her 2nd one and pt will be notified.

## 2016-09-28 LAB — MEASLES/MUMPS/RUBELLA IMMUNITY
Mumps IgG: 80.2 AU/mL — ABNORMAL HIGH (ref <9.00)
Rubella: 8.6 Index — ABNORMAL HIGH (ref <0.90)
Rubeola IgG: >300 AU/mL — ABNORMAL HIGH (ref <25.00)

## 2016-09-28 NOTE — Progress Notes (Signed)
Chief Complaint  Patient presents with  . Annual Exam    nonfasting annual exam with pap. Did not eye exam, had one with Dr [place] recently. Brought in forms for school today. Would like to have vit D checked today.     Megan Torres is a 46 y.o. female who presents for a complete physical.  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 She has forms to be filled out.  See prior messages regarding immunizations needed.  She had +MMR titers earlier this week, h/o +varicella titer. She received Tdap this week, and will need one last vaccination in 6-12 months to complete the series (since no prior documentation, although she likely had prior vaccines)  Vitamin D deficiency--Last level was low at 18 in 10/2014.  She has not taken any supplement (of D or MVI) since 11/2015. Stopped taking PNV then.  Hypothyroidism--off meds x 6 years or so (stopped 2011).  Doesn't plan to f/u with Dr. Dwyane Dee. She denies any changes to hair/nails/skin/bowels/energy. Started back on meds during evaluation for infertility, for 6 months, stopped in 12/2015. Did artificial insemination without success (1 miscarriage, and failed attempts).  Declined IVF trial. Husband is older, decided against further trials, especially since they have no family here.  Sometimes she only walks 2x/week due to some ankle swelling and discomfort in her legs. Saw a Dr. Leonides Schanz (chiropractor), had some burning in her feet, told she had neuropathy. She reports some decreased sensation/vibration. She recommended "treatments", UV light therapy and other chiro treatments for her hips. Getting orthotics due to differences in her arches.  Also being treated for some neck pain after painting in her new house.  Compression socks help with her symptoms. After a plane trip abroad, even though she wore compression stockings on the plane, after taking them off, had significant swelling and  pain lasting for 3-4 days afterwards.  Immunization History  Administered Date(s) Administered  . Influenza Split 02/01/2014  . Influenza, Seasonal, Injecte, Preservative Fre 06/08/2012  . Tdap 06/08/2012, 09/27/2016  got flu shot this year (04/2016 in urgent care) Last Pap smear: possibly through GYN (?done by infertility doctor?), last done here 2012 Last mammogram: 05/2016 Last colonoscopy: never Last DEXA: never Dentist: every 3 months Ophtho: yearly, last in January Exercise: walks 2-3 times/week, 30-45 minutes.   Lipid: Lab Results  Component Value Date   CHOL 168 11/20/2012   HDL 35 (L) 11/20/2012   LDLCALC 97 11/20/2012   TRIG 178 (H) 11/20/2012   CHOLHDL 4.8 11/20/2012   Past Medical History:  Diagnosis Date  . Family history of breast cancer in mother   . Thyroid disease    HYPOTHYROID, h/o hyperthyroidism.  off meds since 8/09  (Dr. Dwyane Dee)  . Vitamin D deficiency 2010    Past Surgical History:  Procedure Laterality Date  . LIPOMA EXCISION  1/08   fatty tumor removed from L axilla (in [place])    Social History   Social History  . Marital status: Married    Spouse name: N/A  . Number of children: N/A  . Years of education: N/A   Occupational History  . Not on file.   Social History Main Topics  . Smoking status: Never Smoker  . Smokeless tobacco: Never Used  . Alcohol use 0.0 oz/week     Comment: 1/4-1/2 glass of wine every 2-3 weeks   . Drug use: No  . Sexual activity:  Yes    Partners: Male    Birth control/ protection:    Other Topics Concern  . Not on file   Social History Narrative   Immigrated from [place] in 2003. Divorced and re-married.  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   Lives with husband, 1 dog ([place] Sam[place], [place])    Family  History  Problem Relation Age of Onset  . Breast cancer Mother 58  . Hypothyroidism Mother   . Ovarian cancer Maternal Aunt   . Diabetes Neg Hx   . Heart disease Neg Hx     Outpatient Encounter Prescriptions as of 09/29/2016  Medication Sig Note  . [DISCONTINUED] Cholecalciferol (VITAMIN D) 2000 UNITS tablet Take 2,000 Units by mouth daily.   . [DISCONTINUED] levothyroxine (LEVOTHROID) 25 MCG tablet Take 1 tablet (25 mcg total) by mouth daily before breakfast.   . [DISCONTINUED] Prenatal Vit-Fe Fumarate-FA (MULTIVITAMIN-PRENATAL) 27-0.8 MG TABS tablet Take 1 tablet by mouth daily at 12 noon.   . [DISCONTINUED] Vitamin D, Ergocalciferol, (DRISDOL) 50000 UNITS CAPS capsule TK 1 C PO EVERY 7 DAYS 12/02/2014: Received from: External Pharmacy   No facility-administered encounter medications on file as of 09/29/2016.     No Known Allergies  ROS: The patient denies anorexia, fever, headaches (just rare), vision changes, decreased hearing, ear pain, sore throat, breast concerns, chest pain, palpitations, dizziness, syncope, dyspnea on exertion, cough, swelling, nausea, vomiting, diarrhea, constipation, abdominal pain, melena, hematochezia, indigestion/heartburn, hematuria, incontinence, dysuria, irregular menstrual cycles, vaginal discharge, odor or itch, genital lesions, joint pains, numbness, tingling, weakness, tremor, suspicious skin lesions, depression, anxiety, abnormal bleeding/bruising, or enlarged lymph nodes. Burning in feet, some ankle swelling, as per HPI.   PHYSICAL EXAM:  BP 100/60 (BP Location: Left Arm, Patient Position: Sitting, Cuff Size: Normal)   Pulse 84   Ht 5' 0.5" (1.537 m)   Wt 138 lb 9.6 oz (62.9 kg)   LMP 09/20/2016 (Exact Date)   BMI 26.62 kg/m   Wt Readings from Last 3 Encounters:  09/29/16 138 lb 9.6 oz (62.9 kg)  01/13/15 137 lb (62.1 kg)  12/02/14 136 lb 6 oz (61.9 kg)     General Appearance:   Alert, cooperative, no distress, appears stated age   Head:   Normocephalic, without obvious abnormality, atraumatic  Eyes:   PERRL, conjunctiva/corneas clear, EOM's intact, fundi benign  Ears:   Normal TM's and external ear canals  Nose:  Nares normal, mucosa normal, no drainage or sinus tenderness  Throat:  Lips, mucosa, and tongue normal; teeth and gums normal  Neck:  Supple, no lymphadenopathy; thyroid: no enlargement/tenderness/nodules; no carotid bruit or JVD  Back:  Spine nontender, no curvature, ROM normal, no CVAtenderness  Lungs:   Clear to auscultation bilaterally without wheezes, rales orronchi; respirations unlabored  Chest Wall:   No tenderness or deformity  Heart:   Regular rate and rhythm, S1 and S2 normal, no murmur, rub or gallop  Breast Exam:   no nipple discharge, inversion, skin dimpling, breast masses or axillary lymphadenopathy  Abdomen:   Soft, non-tender, nondistended, normoactive bowel sounds,   no masses, no hepatosplenomegaly  Genitalia:   normal external genitalia without lesions. BUS and vagina normal, no abnormal discharge.  Cervix is without lesions, slight clear discharge. Slightly stenotic os. No cervical motion tenderness. No  uterine or adnexal tenderness or masses.  Pap obtained.   Rectal:   normal sphincter tone, no mass. Heme negative brown stool.  Extremities:  No clubbing, cyanosis or edema. normal sensation to light touch  Pulses:  2+ and symmetric all extremities  Skin:  Skin color, texture, turgor normal, no rashes or lesions  Lymph nodes:  Cervical, supraclavicular, and axillary nodes normal  Neurologic:  CNII-XII intact, normal strength, sensation and gait; reflexes 2+ and symmetric throughout  Psych: Normal mood, affect, hygiene and grooming   ASSESSMENT/PLAN:  Annual physical exam - Plan: POCT Urinalysis Dipstick, CBC with Differential/Platelet, Lipid panel, VITAMIN D 25 Hydroxy (Vit-D Deficiency, Fractures),  Comprehensive metabolic panel, TSH, Cytology - PAP [place]  Vitamin D deficiency - noncompliant with supplementation, expect it to be low - Plan: VITAMIN D 25 Hydroxy (Vit-D Deficiency, Fractures)  Neuropathy - Plan: Comprehensive metabolic panel, TSH  Infertility, female - no longer trying for pregnancy  Hypothyroidism, unspecified type - asymptomatic, on meds only when attempting pregnancy. ?if related to neuropathy--consider re-trying med if not improving   CBC, TSH, c-met, Vit D, lipid   Discussed monthly self breast exams and yearly mammograms after the age of 88; at least 30 minutes of aerobic activity at least 5 days/week, weight-bearing exercise 2x/wk; proper sunscreen use reviewed; healthy diet, including goals of calcium and vitamin D intake and alcohol recommendations (less than or equal to 1 drink/day) reviewed; regular seatbelt use; changing batteries in smoke detectors.  Immunization recommendations discussed.  Had to get catch-up immunizations for documentation for school (no childhood immunizations available).  Needs last TdaP in 6-12 months.  MMR immune. Varicella previously found to be immune.  Get flu shot in the fall.  Shingrix age 78. Colonoscopy recommendations reviewed, age 36   Expect low D level, [facility] for rx  F/u 1 year sooner prn.

## 2016-09-29 ENCOUNTER — Encounter: Payer: Self-pay | Admitting: Family Medicine

## 2016-09-29 ENCOUNTER — Other Ambulatory Visit (HOSPITAL_COMMUNITY)
Admission: RE | Admit: 2016-09-29 | Discharge: 2016-09-29 | Disposition: A | Discharge status: Home / Self Care | Payer: No Typology Code available for payment source | Source: Ambulatory Visit | Attending: Family Medicine | Admitting: Family Medicine

## 2016-09-29 ENCOUNTER — Ambulatory Visit (INDEPENDENT_AMBULATORY_CARE_PROVIDER_SITE_OTHER): Payer: No Typology Code available for payment source | Admitting: Family Medicine

## 2016-09-29 VITALS — BP 100/60 | HR 84 | Ht 60.5 in | Wt 138.6 lb

## 2016-09-29 DIAGNOSIS — G629 Polyneuropathy, unspecified: Secondary | ICD-10-CM | POA: Diagnosis not present

## 2016-09-29 DIAGNOSIS — E559 Vitamin D deficiency, unspecified: Secondary | ICD-10-CM | POA: Diagnosis not present

## 2016-09-29 DIAGNOSIS — Z0000 Encounter for general adult medical examination without abnormal findings: Secondary | ICD-10-CM | POA: Insufficient documentation

## 2016-09-29 DIAGNOSIS — N979 Female infertility, unspecified: Secondary | ICD-10-CM

## 2016-09-29 DIAGNOSIS — E039 Hypothyroidism, unspecified: Secondary | ICD-10-CM

## 2016-09-29 LAB — POCT URINALYSIS DIPSTICK
Bilirubin, UA: NEGATIVE
Blood, UA: NEGATIVE
Glucose, UA: NEGATIVE
Ketones, UA: NEGATIVE
Leukocytes, UA: NEGATIVE
Nitrite, UA: NEGATIVE
Protein, UA: NEGATIVE
Spec Grav, UA: 1.01
Urobilinogen, UA: NEGATIVE U/dL — AB
pH, UA: 6

## 2016-09-29 NOTE — Patient Instructions (Signed)

## 2016-09-30 ENCOUNTER — Other Ambulatory Visit: Payer: No Typology Code available for payment source

## 2016-09-30 DIAGNOSIS — E559 Vitamin D deficiency, unspecified: Secondary | ICD-10-CM

## 2016-09-30 DIAGNOSIS — G629 Polyneuropathy, unspecified: Secondary | ICD-10-CM

## 2016-09-30 DIAGNOSIS — Z0000 Encounter for general adult medical examination without abnormal findings: Secondary | ICD-10-CM

## 2016-09-30 LAB — CBC WITH DIFFERENTIAL/PLATELET
Basophils Absolute: 0 cells/uL (ref 0–200)
Basophils Relative: 0 %
Eosinophils Absolute: 248 cells/uL (ref 15–500)
Eosinophils Relative: 4 %
HCT: 37.8 % (ref 35.0–45.0)
Hemoglobin: 12.5 g/dL (ref 11.7–15.5)
Lymphocytes Relative: 34 %
Lymphs Abs: 2108 cells/uL (ref 850–3900)
MCH: 28.2 pg (ref 27.0–33.0)
MCHC: 33.1 g/dL (ref 32.0–36.0)
MCV: 85.1 fL (ref 80.0–100.0)
MPV: 10.9 fL (ref 7.5–12.5)
Monocytes Absolute: 496 cells/uL (ref 200–950)
Monocytes Relative: 8 %
Neutro Abs: 3348 cells/uL (ref 1500–7800)
Neutrophils Relative %: 54 %
Platelets: 263 10*3/uL (ref 140–400)
RBC: 4.44 MIL/uL (ref 3.80–5.10)
RDW: 13.5 % (ref 11.0–15.0)
WBC: 6.2 10*3/uL (ref 4.0–10.5)

## 2016-09-30 LAB — TSH: TSH: 4.35 mIU/L

## 2016-10-01 ENCOUNTER — Other Ambulatory Visit: Payer: Self-pay | Admitting: Family Medicine

## 2016-10-01 DIAGNOSIS — E559 Vitamin D deficiency, unspecified: Secondary | ICD-10-CM

## 2016-10-01 LAB — LIPID PANEL
Cholesterol: 174 mg/dL (ref <200)
HDL: 39 mg/dL — ABNORMAL LOW (ref >50)
LDL Cholesterol: 112 mg/dL — ABNORMAL HIGH (ref <100)
Total CHOL/HDL Ratio: 4.5 Ratio (ref <5.0)
Triglycerides: 116 mg/dL (ref <150)
VLDL: 23 mg/dL (ref <30)

## 2016-10-01 LAB — COMPREHENSIVE METABOLIC PANEL
ALT: 19 U/L (ref 6–29)
AST: 21 U/L (ref 10–35)
Albumin: 4 g/dL (ref 3.6–5.1)
Alkaline Phosphatase: 71 U/L (ref 33–115)
BUN: 8 mg/dL (ref 7–25)
CO2: 18 mmol/L — ABNORMAL LOW (ref 20–31)
Calcium: 8.9 mg/dL (ref 8.6–10.2)
Chloride: 105 mmol/L (ref 98–110)
Creat: 0.58 mg/dL (ref 0.50–1.10)
Glucose, Bld: 101 mg/dL — ABNORMAL HIGH (ref 65–99)
Potassium: 4 mmol/L (ref 3.5–5.3)
Sodium: 138 mmol/L (ref 135–146)
Total Bilirubin: 0.5 mg/dL (ref 0.2–1.2)
Total Protein: 6.7 g/dL (ref 6.1–8.1)

## 2016-10-01 LAB — VITAMIN D 25 HYDROXY (VIT D DEFICIENCY, FRACTURES): Vit D, 25-Hydroxy: 13 ng/mL — ABNORMAL LOW (ref 30–100)

## 2016-10-01 MED ORDER — VITAMIN D (ERGOCALCIFEROL) 1.25 MG (50000 UNIT) PO CAPS: 50000.0000 [IU] | ORAL_CAPSULE | ORAL | 0 refills | Status: DC

## 2016-10-04 LAB — CYTOLOGY - PAP
Diagnosis: NEGATIVE
HPV: NOT DETECTED

## 2016-10-21 ENCOUNTER — Telehealth: Payer: Self-pay | Admitting: *Deleted

## 2016-10-21 DIAGNOSIS — E039 Hypothyroidism, unspecified: Secondary | ICD-10-CM

## 2016-10-21 MED ORDER — SYNTHROID 25 MCG PO TABS: 25.0000 ug | ORAL_TABLET | Freq: Every day | ORAL | 1 refills | Status: DC

## 2016-10-21 NOTE — Telephone Encounter (Signed)
Spoke with patient and scheduled her for 12/02/16 at 3:15pm.

## 2016-10-21 NOTE — Telephone Encounter (Signed)
Patient called and stated that she is having some hair loss and would like to get back on her synthroid if possible.

## 2016-10-21 NOTE — Telephone Encounter (Signed)
Advise pt that rx for Synthroid was sent to her pharmacy. Please schedule OV for 6 weeks and we will draw labs at the visit.

## 2016-10-25 ENCOUNTER — Other Ambulatory Visit: Payer: Self-pay | Admitting: *Deleted

## 2016-10-25 DIAGNOSIS — E039 Hypothyroidism, unspecified: Secondary | ICD-10-CM

## 2016-10-25 MED ORDER — SYNTHROID 25 MCG PO TABS: 25.0000 ug | ORAL_TABLET | Freq: Every day | ORAL | 1 refills | Status: DC

## 2016-11-28 ENCOUNTER — Other Ambulatory Visit: Payer: Self-pay | Admitting: Family Medicine

## 2016-11-28 DIAGNOSIS — E559 Vitamin D deficiency, unspecified: Secondary | ICD-10-CM

## 2016-11-29 NOTE — Telephone Encounter (Signed)
LMTCB- pt needs to be taking 2000iu daily after completion of 12 week therapy.

## 2016-12-01 NOTE — Progress Notes (Signed)
Chief Complaint  Patient presents with  . Hypothyroidism    6 week follow up on TSH and recheck TSH today at visit.    Patient presents to f/u on hypothyroidism. She had been off meds for a while (started on them by fertility doctor in 2017).  She called in July with complaints of hair loss, and wanting to get back on medication.  She was restarted on 63mcg of Synthroid.  Her recent TSH had been WNL, borderline: Lab Results  Component Value Date   TSH 4.35 09/30/2016   She is due for repeat testing today  Her hair thinning persists, but not as much.  Her energy has improved since being on the thyroid medication.  She has also started taking B12 and Vitamin D prescription.  Takes synthroid in the morning, on an empty stomach, and takes her vitamins at night.   +stress related to school.  She has noted some weight gain, which she attributes to weight.  Is still eating well, and getting regular exercise. No sodas, eliminated sugar from her diet. Going to the gym 2-3 times/week, walks 2x/week for an hour. Very sedentary with classes and studying/reading, and commuting from [place].  No changes in skin or bowels.  sleeping only 4-6 hours when in school (8am class, leaves at 6am to commute).  Studies until 1am. Sleeps 7-8 hours on the weekends, a couple of weekends she slept 10 hours straight.  Term ends 9/15, then structured interviews. Classes through June.  Very stressful  PMH, PSH, SH reviewed  Outpatient Encounter Prescriptions as of 12/02/2016  Medication Sig  . SYNTHROID 25 MCG tablet Take 1 tablet (25 mcg total) by mouth daily before breakfast.  . Vitamin D, Ergocalciferol, (DRISDOL) 50000 units CAPS capsule Take 1 capsule (50,000 Units total) by mouth every 7 (seven) days.   No facility-administered encounter medications on file as of 12/02/2016.    No Known Allergies  ROS:  No fever, chills, headaches, dizziness, URI symptoms, chest pain, palpitations, shortness of  breath, bowel changes, skin changes. +fatigue, weight gain per HPI.  Moods are good, +stress.   PHYSICAL EXAM:  BP 108/64 (BP Location: Left Arm, Patient Position: Sitting, Cuff Size: Normal)   Pulse 72   Ht 5' 0.5" (1.537 m)   Wt 141 lb (64 kg)   LMP 11/12/2016 (Exact Date)   BMI 27.08 kg/m   Wt Readings from Last 3 Encounters:  12/02/16 141 lb (64 kg)  09/29/16 138 lb 9.6 oz (62.9 kg)  01/13/15 137 lb (62.1 kg)   Well developed, pleasant, slightly overweight female in no distress HEENT: conjunctiva and sclera are clear, EOMI.  OP clear Neck: no lymphadenopathy, thyromegaly or mass Heart: regular rate and rhythm Lungs: clear bilaterally Extremities: no edema Neuro: alert and oriented, cranial nerves intact, normal strength, gait, DTR's 2+ and symmetric, normal relaxation phase Psych: normal mood, affect, hygiene and grooming, some reported stress. Normal speech, eye contact.   ASSESSMENT/PLAN:  Hypothyroidism, unspecified type - Plan: TSH  Need for influenza vaccination - Plan: Flu Vaccine QUAD 6+ mos PF IM (Fluarix Quad PF)  Other fatigue - r/o thyroid abnl; suspect related to inadequate sleep. Counseled   TSH today, adjust med as needed.  Still has 1 refill left of 25 mcg (because she started with leftovers she had of 25 mcg). Flu shot given today, counseled re: risks.  If changing thyroid med dose, needs new rx --[facility]  Coming in December for last Tdap-- Likely would be a  good time to recheck if meds not being adjusted based on today's results.  Counseled re: adequate sleep, catching up on weekends.  She is looking into staying over some of the nights in [facility] [place], to minimize wasted driving time. She comes home at the end of the day and is completely exhausted after the drive.  Discussed diet, exercise, stress reduction.

## 2016-12-02 ENCOUNTER — Ambulatory Visit (INDEPENDENT_AMBULATORY_CARE_PROVIDER_SITE_OTHER): Payer: BLUE CROSS/BLUE SHIELD | Admitting: Family Medicine

## 2016-12-02 ENCOUNTER — Encounter: Payer: Self-pay | Admitting: Family Medicine

## 2016-12-02 VITALS — BP 108/64 | HR 72 | Ht 60.5 in | Wt 141.0 lb

## 2016-12-02 DIAGNOSIS — R5383 Other fatigue: Secondary | ICD-10-CM

## 2016-12-02 DIAGNOSIS — Z23 Encounter for immunization: Secondary | ICD-10-CM

## 2016-12-02 DIAGNOSIS — E039 Hypothyroidism, unspecified: Secondary | ICD-10-CM

## 2016-12-02 NOTE — Patient Instructions (Signed)
Continue your regular exercise. Try and get adequate sleep (each week, if not daily--try and catch up some on the weekends). We will be in touch with your results tomorrow.

## 2016-12-03 LAB — TSH: TSH: 2.53 mIU/L

## 2017-01-19 ENCOUNTER — Other Ambulatory Visit: Payer: Self-pay | Admitting: Emergency Medicine

## 2017-01-19 ENCOUNTER — Ambulatory Visit
Admission: RE | Admit: 2017-01-19 | Discharge: 2017-01-19 | Disposition: A | Discharge status: Home / Self Care | Payer: PRIVATE HEALTH INSURANCE | Source: Ambulatory Visit | Attending: Emergency Medicine | Admitting: Emergency Medicine

## 2017-01-19 DIAGNOSIS — R519 Headache, unspecified: Secondary | ICD-10-CM

## 2017-01-19 DIAGNOSIS — R51 Headache: Principal | ICD-10-CM

## 2017-03-29 ENCOUNTER — Encounter: Payer: Self-pay | Admitting: Family Medicine

## 2017-03-29 ENCOUNTER — Other Ambulatory Visit: Payer: No Typology Code available for payment source

## 2017-03-31 ENCOUNTER — Telehealth: Payer: Self-pay | Admitting: *Deleted

## 2017-03-31 ENCOUNTER — Telehealth: Payer: Self-pay

## 2017-03-31 ENCOUNTER — Other Ambulatory Visit (INDEPENDENT_AMBULATORY_CARE_PROVIDER_SITE_OTHER): Payer: BLUE CROSS/BLUE SHIELD

## 2017-03-31 DIAGNOSIS — Z23 Encounter for immunization: Secondary | ICD-10-CM

## 2017-03-31 NOTE — Telephone Encounter (Signed)
Pt came in for 3rd tdap and had questions about labs from 03/23/17 with TSH at 5. Does pt need to make changes to her Synthroid? Thank you, Wells Guiles

## 2017-03-31 NOTE — Telephone Encounter (Signed)
Patient called and she saw doctor twice in [place] back in Oct and UC here for migraines. There is a CT in imaging and she is going to have notes faxed over. The doctor in [place] recommended that she be referred to neuro but would not refer her-told her to see her PCP. She is wanting to be referred to [facility] at [facility]. She scheduled an appt with you for 04/13/17 but called to see if you would just refer her without being seen? I told her I did not know but I would ask and she should keep the appt in case not. Please advise.

## 2017-04-07 NOTE — Telephone Encounter (Signed)
See other message.  TSH normal here in August.  Rec discussing at upcoming visit (to see if there are symptoms, if dose should be increased)

## 2017-04-07 NOTE — Telephone Encounter (Signed)
I would like to see her at her scheduled visit, since we did not discuss her headaches at her June physical (was only having rare headaches at that time).  Also, need to touch base regarding her thyroid symptoms (see message from 12/20), given the borderline results sent to [place] from outside lab in December.  Her TSH was normal when she was here in August for recheck after having been restarted on 63mcg dose.

## 2017-04-08 NOTE — Telephone Encounter (Signed)
Left message advising patient

## 2017-04-11 NOTE — Progress Notes (Signed)
Chief Complaint  Patient presents with  . Migraine    seeking referral to neuro-Dr Delice Lesch at [facility].     Patient presents to discuss her headaches.   She went to urgent care twice in the last couple of months with migraines, both related to her menstrual cycle. 01/19/17 she went to [facility] with 1d h/o headache. She was treated with toradol, prescribed zofran and naproxen 500mg  and sent for CT scan, which was normal.  She went to [facility] urgent care on 02/11/17 with headache, diagnosed with migraine.  She was treated with toradol and decadron, and prescribed zofran 4mg , imitrex 50mg . It was noted that there may be a menstrual component--both of these headaches were around the start of her cycle.   She also had another UC visit at [facility] on 12/12 for acute gastroenteritis. She developed headache after vomiting/diarrhea from dehydration.  She was given IV fluids. Took 4-5 days to feel better (from GI symptoms and headaches).  This happened during exam time, and is trying to get extensions.  Her period in December was late, 12/23.  She had headache 12/24 and 12/25.  Not as bad as the prior headaches, just "nagging".  She took advil.  No associated nausea.  She has had slight aura preceding headache in November. Not with the recent headache  +photophobia, phonophobia with the migraines. Using computers much more since back in school, which she thinks may contribute to headaches.  Due for another eye exam (having trouble seeing when switching back and forth from up close to far away, constantly putting her glasses on and off).  She never took any of the imitrex, didn't pick up from pharmacy.  She is requesting refill of the naproxen 500mg , which she did take, and finish.  She didn't have side effects.  She has h/o severe headaches, mainly related to colds and allergies in the past.  She had labs from [facility] sent to [place], but we never got the UC notes.  Labs were reviewed--K  was elevated, she was dehydrated, difficult stick.  TSH was elevated at 5.95.  Denies new thyroid symptoms. Thyroid was normal a few months prior, when not sick, at our office. Lab Results  Component Value Date   TSH 2.53 12/02/2016   PMH, PSH, SH reviewed  Outpatient Encounter Medications as of 04/13/2017  Medication Sig  . Cholecalciferol (VITAMIN D) 2000 units tablet Take 2,000 Units by mouth daily.  Marland Kitchen SYNTHROID 25 MCG tablet Take 1 tablet (25 mcg total) by mouth daily before breakfast.  . naproxen (NAPROSYN) 500 MG tablet Take 1 tablet (500 mg total) by mouth 2 (two) times daily as needed for headache. Take with food  . [DISCONTINUED] ondansetron (ZOFRAN-ODT) 4 MG disintegrating tablet   . [DISCONTINUED] Vitamin D, Ergocalciferol, (DRISDOL) 50000 units CAPS capsule Take 1 capsule (50,000 Units total) by mouth every 7 (seven) days.   No facility-administered encounter medications on file as of 04/13/2017.    Imitrex 50mg  (never filled from pharmacy, prescribed by UC).  No Known Allergies  ROS: no fever, chills.  +headaches per HPI. Frequent nausea, not just related to headaches.  +stress and poor sleep due to stress. Had diarrhea and vomiting 12/12, per HPI, lasting a few days, resolved. No bleeding, bruising, rash, chest pain, shortness of breath or other concerns. Currently denies URI symptoms. No numbness, tingling, weakness, dizziness.   PHYSICAL EXAM:  BP 110/70   Pulse 72   Ht 5' 0.5" (1.537 m)  Wt 142 lb 9.6 oz (64.7 kg)   LMP 04/13/2017   BMI 27.39 kg/m   Wt Readings from Last 3 Encounters:  04/13/17 142 lb 9.6 oz (64.7 kg)  12/02/16 141 lb (64 kg)  09/29/16 138 lb 9.6 oz (62.9 kg)   Well appearing, pleasant female in no distress HEENT: PERRL EOMI, conjunctiva and sclera are clear, fundi benign. Nasal mucosa is mildly edematous, no drainage. OP is clear Neck: no lymphadenopathy, thyromegaly or mass Heart: regular rate and rhythm Lungs: clear  bilaterally Extremities: no edema Neuro: alert and oriented, cranial nerves intact, normal strength, gait, finger to nose, DTR's. No pronator drift Psych: normal mood, affect, hygiene and grooming, normal speech, eye contact  ASSESSMENT/PLAN:  Menstrual migraine without status migrainosus, not intractable - menstrual component. NSAID+caffeine with aura, imitrex when headache develops. keep headache log, reviewed triggers.  f/u 2 mos, sooner prn - Plan: naproxen (NAPROSYN) 500 MG tablet  Situational stress - re: school. Counseled re: stress reduction, stress as trigger for headaches  Hypothyroidism, unspecified type - euthyroid by history.  Recent mildly elevated TSH at UC was with intercurrent illness.  Continue current dose  Counseled re: info to keep track of on headache calendar. Counseled re: migraine triggers Counseled re: meds to take for which symptoms. Encouraged eye exam, work on stress reduction and getting adequate sleep. F/u in 2 weeks with headache log.  Given current frequency of HA's (1x/month), not indicated to prescribe preventative med. Briefly discussed the various preventative meds, vs hormones if specifically only with menstrual cycles.   Will monitor and f/u.  All questions answered.  30 min visit, more than 1/2 spent counseling.    Follow up with your eye doctor. It sounds as though there may be a hormonal factor to your headaches. If you get a visual aura/warning that headache is coming, take 500mg  of naproxen OR 800mg  of ibuprofen, along with some caffeine (prior to getting any headache). Sometimes this can prevent the headache entirely.  If you develop the headache, take the imitrex. Start with 50mg , and take a second tablet 2 hours later if not better.  If you needed both tablets, you can start with 100mg  (both together) with your next headache). You can only take 2/24 hour period.   Use the ondansetron (zofran) if needed for nausea.  Try and minimize all  triggers for migraines (not skipping meals, sleep well, treat nausea and or other headaches/pain prior to triggering the full migraine; work on stress reduction).  Keep a headache calendar--keep track of symptoms, severity of the headache, and your cycles.  Bring this to your visit in 1-2 months.

## 2017-04-13 ENCOUNTER — Encounter: Payer: Self-pay | Admitting: Family Medicine

## 2017-04-13 ENCOUNTER — Ambulatory Visit (INDEPENDENT_AMBULATORY_CARE_PROVIDER_SITE_OTHER): Payer: BLUE CROSS/BLUE SHIELD | Admitting: Family Medicine

## 2017-04-13 VITALS — BP 110/70 | HR 72 | Ht 60.5 in | Wt 142.6 lb

## 2017-04-13 DIAGNOSIS — E039 Hypothyroidism, unspecified: Secondary | ICD-10-CM | POA: Diagnosis not present

## 2017-04-13 DIAGNOSIS — F439 Reaction to severe stress, unspecified: Secondary | ICD-10-CM | POA: Diagnosis not present

## 2017-04-13 DIAGNOSIS — G43829 Menstrual migraine, not intractable, without status migrainosus: Secondary | ICD-10-CM

## 2017-04-13 MED ORDER — NAPROXEN 500 MG PO TABS: 500.0000 mg | ORAL_TABLET | Freq: Two times a day (BID) | ORAL | 0 refills | Status: DC | PRN

## 2017-04-13 NOTE — Patient Instructions (Signed)
Follow up with your eye doctor. It sounds as though there may be a hormonal factor to your headaches. If you get a visual aura/warning that headache is coming, take 500mg  of naproxen OR 800mg  of ibuprofen, along with some caffeine (prior to getting any headache). Sometimes this can prevent the headache entirely.  If you develop the headache, take the imitrex. Start with 50mg , and take a second tablet 2 hours later if not better.  If you needed both tablets, you can start with 100mg  (both together) with your next headache). You can only take 2/24 hour period.   Use the ondansetron (zofran) if needed for nausea.  Try and minimize all triggers for migraines (not skipping meals, sleep well, treat nausea and or other headaches/pain prior to triggering the full migraine; work on stress reduction).  Keep a headache calendar--keep track of symptoms, severity of the headache, and your cycles.  Bring this to your visit in 1-2 months.   Migraine Headache A migraine headache is an intense, throbbing pain on one side or both sides of the head. Migraines may also cause other symptoms, such as nausea, vomiting, and sensitivity to light and noise. What are the causes? Doing or taking certain things may also trigger migraines, such as:  Alcohol.  Smoking.  Medicines, such as: ? Medicine used to treat chest pain (nitroglycerine). ? Birth control pills. ? Estrogen pills. ? Certain blood pressure medicines.  Aged cheeses, chocolate, or caffeine.  Foods or drinks that contain nitrates, glutamate, aspartame, or tyramine.  Physical activity.  Other things that may trigger a migraine include:  Menstruation.  Pregnancy.  Hunger.  Stress, lack of sleep, too much sleep, or fatigue.  Weather changes.  What increases the risk? The following factors may make you more likely to experience migraine headaches:  Age. Risk increases with age.  Family history of migraine headaches.  Being  Caucasian.  Depression and anxiety.  Obesity.  Being a woman.  Having a hole in the heart (patent foramen ovale) or other heart problems.  What are the signs or symptoms? The main symptom of this condition is pulsating or throbbing pain. Pain may:  Happen in any area of the head, such as on one side or both sides.  Interfere with daily activities.  Get worse with physical activity.  Get worse with exposure to bright lights or loud noises.  Other symptoms may include:  Nausea.  Vomiting.  Dizziness.  General sensitivity to bright lights, loud noises, or smells.  Before you get a migraine, you may get warning signs that a migraine is developing (aura). An aura may include:  Seeing flashing lights or having blind spots.  Seeing bright spots, halos, or zigzag lines.  Having tunnel vision or blurred vision.  Having numbness or a tingling feeling.  Having trouble talking.  Having muscle weakness.  How is this diagnosed? A migraine headache can be diagnosed based on:  Your symptoms.  A physical exam.  Tests, such as CT scan or MRI of the head. These imaging tests can help rule out other causes of headaches.  Taking fluid from the spine (lumbar puncture) and analyzing it (cerebrospinal fluid analysis, or CSF analysis).  How is this treated? A migraine headache is usually treated with medicines that:  Relieve pain.  Relieve nausea.  Prevent migraines from coming back.  Treatment may also include:  Acupuncture.  Lifestyle changes like avoiding foods that trigger migraines.  Follow these instructions at home: Medicines  Take over-the-counter and prescription medicines  only as told by your health care provider.  Do not drive or use heavy machinery while taking prescription pain medicine.  To prevent or treat constipation while you are taking prescription pain medicine, your health care provider may recommend that you: ? Drink enough fluid to keep  your urine clear or pale yellow. ? Take over-the-counter or prescription medicines. ? Eat foods that are high in fiber, such as fresh fruits and vegetables, whole grains, and beans. ? Limit foods that are high in fat and processed sugars, such as fried and sweet foods. Lifestyle  Avoid alcohol use.  Do not use any products that contain nicotine or tobacco, such as cigarettes and e-cigarettes. If you need help quitting, ask your health care provider.  Get at least 8 hours of sleep every night.  Limit your stress. General instructions   Keep a journal to find out what may trigger your migraine headaches. For example, write down: ? What you eat and drink. ? How much sleep you get. ? Any change to your diet or medicines.  If you have a migraine: ? Avoid things that make your symptoms worse, such as bright lights. ? It may help to lie down in a dark, quiet room. ? Do not drive or use heavy machinery. ? Ask your health care provider what activities are safe for you while you are experiencing symptoms.  Keep all follow-up visits as told by your health care provider. This is important. Contact a health care provider if:  You develop symptoms that are different or more severe than your usual migraine symptoms. Get help right away if:  Your migraine becomes severe.  You have a fever.  You have a stiff neck.  You have vision loss.  Your muscles feel weak or like you cannot control them.  You start to lose your balance often.  You develop trouble walking.  You faint. This information is not intended to replace advice given to you by your health care provider. Make sure you discuss any questions you have with your health care provider. Document Released: 03/29/2005 Document Revised: 10/17/2015 Document Reviewed: 09/15/2015 Elsevier Interactive Patient Education  2017 [employer].

## 2017-04-14 ENCOUNTER — Telehealth: Payer: Self-pay | Admitting: Family Medicine

## 2017-04-14 MED ORDER — SUMATRIPTAN SUCCINATE 50 MG PO TABS: ORAL_TABLET | ORAL | 0 refills | Status: DC

## 2017-04-14 NOTE — Telephone Encounter (Signed)
Pt would like Imitrex sent to [facility] at [facility]

## 2017-04-14 NOTE — Telephone Encounter (Signed)
done

## 2017-04-25 ENCOUNTER — Encounter: Payer: Self-pay | Admitting: Family Medicine

## 2017-05-24 ENCOUNTER — Other Ambulatory Visit: Payer: Self-pay | Admitting: Family Medicine

## 2017-05-24 DIAGNOSIS — Z1231 Encounter for screening mammogram for malignant neoplasm of breast: Secondary | ICD-10-CM

## 2017-06-14 ENCOUNTER — Ambulatory Visit
Admission: RE | Admit: 2017-06-14 | Discharge: 2017-06-14 | Disposition: A | Discharge status: Home / Self Care | Payer: PRIVATE HEALTH INSURANCE | Source: Ambulatory Visit | Attending: Family Medicine | Admitting: Family Medicine

## 2017-06-14 DIAGNOSIS — Z1231 Encounter for screening mammogram for malignant neoplasm of breast: Secondary | ICD-10-CM

## 2017-06-15 ENCOUNTER — Other Ambulatory Visit: Payer: Self-pay | Admitting: Family Medicine

## 2017-06-15 DIAGNOSIS — R928 Other abnormal and inconclusive findings on diagnostic imaging of breast: Secondary | ICD-10-CM

## 2017-06-17 ENCOUNTER — Encounter: Payer: Self-pay | Admitting: Family Medicine

## 2017-06-20 ENCOUNTER — Encounter: Payer: Self-pay | Admitting: Family Medicine

## 2017-06-28 NOTE — Progress Notes (Deleted)
  Patient presents for follow-up on her headaches.  At last visit, she was advised: If you get a visual aura/warning that headache is coming, take 500mg  of naproxen OR 800mg  of ibuprofen, along with some caffeine (prior to getting any headache). Sometimes this can prevent the headache entirely.  If you develop the headache, take the imitrex. Start with 50mg , and take a second tablet 2 hours later if not better.  If you needed both tablets, you can start with 100mg  (both together) with your next headache). You can only take 2/24 hour period.   Use the ondansetron (zofran) if needed for nausea. Try and minimize all triggers for migraines (not skipping meals, sleep well, treat nausea and or other headaches/pain prior to triggering the full migraine; work on stress reduction). Keep a headache calendar--keep track of symptoms, severity of the headache, and your cycles.      Hypothyroidism:  She remains on 53mcg Synthroid.  Last TSH here in August was normal.  TSH at [facility] was elevated, but during illness. She denies changes in energy, weight, hair/skin/bowels, moods.   PHYSICAL EXAM:  Wt Readings from Last 3 Encounters:  04/13/17 142 lb 9.6 oz (64.7 kg)  12/02/16 141 lb (64 kg)  09/29/16 138 lb 9.6 oz (62.9 kg)     ASSESSMENT/PLAN:  Offer recheck of TSH now, vs at her physical in June.

## 2017-06-29 ENCOUNTER — Encounter: Payer: BLUE CROSS/BLUE SHIELD | Admitting: Family Medicine

## 2017-06-29 ENCOUNTER — Ambulatory Visit
Admission: RE | Admit: 2017-06-29 | Discharge: 2017-06-29 | Disposition: A | Discharge status: Home / Self Care | Payer: BLUE CROSS/BLUE SHIELD | Source: Ambulatory Visit | Attending: Family Medicine | Admitting: Family Medicine

## 2017-06-29 ENCOUNTER — Telehealth: Payer: Self-pay | Admitting: *Deleted

## 2017-06-29 ENCOUNTER — Other Ambulatory Visit: Payer: Self-pay | Admitting: *Deleted

## 2017-06-29 DIAGNOSIS — E039 Hypothyroidism, unspecified: Secondary | ICD-10-CM

## 2017-06-29 DIAGNOSIS — R928 Other abnormal and inconclusive findings on diagnostic imaging of breast: Secondary | ICD-10-CM

## 2017-06-29 MED ORDER — SYNTHROID 25 MCG PO TABS: 25.0000 ug | ORAL_TABLET | Freq: Every day | ORAL | 0 refills | Status: DC

## 2017-06-29 NOTE — Telephone Encounter (Signed)
Patient called and left message on my vm yesterday and wanted to know if you needed to see her today. She has not had any serious migraines. Has had one or two and has taken the medicine and it has helped. She is also managing her time better and this has helped-wanted to knoe if it is necessary to come in today if she is doing better.

## 2017-06-29 NOTE — Telephone Encounter (Signed)
Message given to patient in detail on vm.

## 2017-06-29 NOTE — Telephone Encounter (Signed)
I'm glad that she has only had 1 or 2 headaches since last visit. I was going to review her headache log, and discuss with her whether we should repeat TSH today (since it was abnormal when checked at UC/ER visit, over 6 mos since last checked here) vs waiting until her June visit. If she is doing well, no symptoms, and prefers to wait until June, then okay to cancel.

## 2017-08-16 ENCOUNTER — Encounter: Payer: Self-pay | Admitting: Family Medicine

## 2017-10-04 NOTE — Progress Notes (Signed)
Chief Complaint  Patient presents with  . Annual Exam    fasting annual exam no pap. Had recent eye exam. Only concern is her legs. Knees have been swelling as well as her ankles. Legs feel hot and achy and worn out, esp her ankles and feet. Lot of pain aftrer walking.     Megan Torres is a 47 y.o. female who presents for a complete physical.  She has the following concerns:  Leg discomfort as described in chief complaint.  This is similar to her complaint last year.  She currently reports that her ankle swelling is much better, mostly just with prolonged sitting, driving. She is complaining of pain and swelling at her knees (below the knee, bilaterally, tender to touch), with focal tender areas that are relieved by massage.  Topical medications such as Biofreeze have been helpful.  The burning spreads down to the foot and ankles, laterally, and into the lateral top of the foot.  Her calves can feel heavy, weak, burning.  H/o shin splints.  chiro treatments helped with that.  Continues to wear custom orthotics from the chiro, in her walking shoes and work shoes. Hasn't been using compression socks over the last year, forgot about them. Note from last year reported it was helpful.  She reports no longer having neck and back pain, these were treated by her chiropractor and changes in posture/position at work.  She graduated a few weeks ago, and has a job working at [employer], working as Passenger transport manager, in [place]. She will have far less commuting/driving than she has had.  Menstrual migraines: Last discussed in January, after having headaches treated at urgent cares in October and December.  She canceled her March visit, as her headaches had improved, infrequent.  +photophobia, phonophobia with the migraines.   She saw eye doctor, changed to bifocals.  Thinks headaches were also related to stress, only 1 headache since last visit. Some slight sensitivity 2 days ago, relieved by advil and  going back to bed (got woken up early. No longer getting headaches related to cycles. She was prescribed imitrex, as well as naproxen. Needed naproxen just once, never the imitrex.  She had labs from [facility] sent to [place], showing K was elevated; she was dehydrated, difficult stick.  TSH was elevated at 5.95.  Thyroid was normal a few months prior (August), when not sick, at our office. She wasn't having thyroid-related symptoms at her last visit in January; no med adjustment was made. She has h/o hypothyroidism--had been off meds for many years (stopped 2011), previously monitored by Dr. Dwyane Dee.  Started back on meds during evaluation for infertility, for 6 months, stopped in 12/2015.  TSH was 4.35 in 09/2016; she called in July asking to restart thyroid medication, due to hair loss.  She restarted Synthroid 71mg daily, and recheck TSH in August was: Lab Results  Component Value Date   TSH 2.53 12/02/2016  She stopped taking Synthroid 4 weeks ago.  She was very busy during finals, had missed some pills, and just stopped it completely 4 weeks ago.  Hair is fine, seems like normal hair loss.  Vitamin D deficiency--Last level was low at 13 in 09/2016.  She hadn't been on any vitamin D supplement at that time (since stopping her PNV).  She was treated with 12 weeks of prescription vitamin D weekly.  She had been taking 2000 IU daily up until 2 weeks ago (occasional missed dose); hasn't taken any vitamin D in the  last 2 weeks.   Immunization History  Administered Date(s) Administered  . Influenza Split 02/01/2014  . Influenza, Seasonal, Injecte, Preservative Fre 06/08/2012  . Influenza,inj,Quad PF,6+ Mos 12/02/2016  . Tdap 06/08/2012, 09/27/2016, 03/31/2017   Last Pap smear: 09/2016, normal, no high risk HPV present Last mammogram: 06/2017 (also requiring diagnostic mammo and [place] on the left, benign) Last colonoscopy: never Last DEXA: never Dentist: every 3 months Ophtho: yearly, went recently  and changed to bifocals Exercise: not as regular since March, more exercise in the last few weeks (which has caused flare of her leg discomfort, per HPI).  Uses 5# weights 3-4 times/week.    Lipid: Lab Results  Component Value Date   CHOL 174 09/30/2016   HDL 39 (L) 09/30/2016   LDLCALC 112 (H) 09/30/2016   TRIG 116 09/30/2016   CHOLHDL 4.5 09/30/2016   Past Medical History:  Diagnosis Date  . Family history of breast cancer in mother   . Family history of breast cancer in mother    she was never tested for BRCA  . Family history of ovarian cancer   . Thyroid disease    HYPOTHYROID, h/o hyperthyroidism.  off meds since 8/09  (Dr. Dwyane Dee)  . Vitamin D deficiency 2010    Past Surgical History:  Procedure Laterality Date  . LIPOMA EXCISION  1/08   fatty tumor removed from L axilla (in [place])    Social History   Socioeconomic History  . Marital status: Married    Spouse name: Not on file  . Number of children: Not on file  . Years of education: Not on file  . Highest education level: Not on file  Occupational History  . Not on file  Social Needs  . Financial resource strain: Not on file  . Food insecurity:    Worry: Not on file    Inability: Not on file  . Transportation needs:    Medical: Not on file    Non-medical: Not on file  Tobacco Use  . Smoking status: Never Smoker  . Smokeless tobacco: Never Used  Substance and Sexual Activity  . Alcohol use: Yes    Alcohol/week: 0.0 oz    Comment: occasional (1-2/month)  . Drug use: No  . Sexual activity: Yes    Partners: Male  Lifestyle  . Physical activity:    Days per week: Not on file    Minutes per session: Not on file  . Stress: Not on file  Relationships  . Social connections:    Talks on phone: Not on file    Gets together: Not on file    Attends religious service: Not on file    Active member of club or organization: Not on file    Attends meetings of clubs or organizations: Not on file     Relationship status: Not on file  . Intimate partner violence:    Fear of current or ex partner: Not on file    Emotionally abused: Not on file    Physically abused: Not on file    Forced sexual activity: Not on file  Other Topics Concern  . Not on file  Social History Narrative   Immigrated from [place] in 2003. Divorced and re-married.  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  Just graduated 08/2017.    Family History  Problem Relation Age of Onset  . Breast cancer Mother 27  . Hypothyroidism Mother   . Ovarian cancer Maternal Aunt   . Diabetes Neg Hx   . Heart disease Neg Hx     Outpatient Encounter Medications as of 10/06/2017  Medication Sig Note  . Cholecalciferol (VITAMIN D) 2000 units tablet Take 2,000 Units by mouth daily. 10/06/2017: Hasn't taken in 2 weeks  . SUMAtriptan (IMITREX) 50 MG tablet Take 1 tablet at onset of migraine. May repeat once in 2 hours (max '100mg'$ /24 hours) (Patient not taking: Reported on 10/06/2017)   . SYNTHROID 25 MCG tablet Take 1 tablet (25 mcg total) by mouth daily before breakfast. (Patient not taking: Reported on 10/06/2017) 10/06/2017: Has not taken in one month  . [DISCONTINUED] naproxen (NAPROSYN) 500 MG tablet Take 1 tablet (500 mg total) by mouth 2 (two) times daily as needed for headache. Take with food    No facility-administered encounter medications on file as of 10/06/2017.     No Known Allergies   ROS: The patient denies anorexia, fever, headaches (just rare), vision changes, decreased hearing, ear pain, sore throat, breast concerns, chest pain, palpitations, dizziness, syncope, dyspnea on  exertion, cough, swelling, nausea, vomiting, diarrhea, constipation, abdominal pain, melena, hematochezia, indigestion/heartburn, hematuria, incontinence, dysuria, irregular menstrual cycles, vaginal discharge, odor or itch, genital lesions, joint pains, numbness, tingling, weakness, tremor, suspicious skin lesions, depression, anxiety, abnormal bleeding/bruising, or enlarged lymph nodes. Leg pain per HPI.   PHYSICAL EXAM:  BP 120/70   Pulse 72   Ht '5\' 1"'$  (1.549 m)   Wt 145 lb (65.8 kg)   LMP 09/22/2017   BMI 27.40 kg/m   Wt Readings from Last 3 Encounters:  10/06/17 145 lb (65.8 kg)  04/13/17 142 lb 9.6 oz (64.7 kg)  12/02/16 141 lb (64 kg)     General Appearance:   Alert, cooperative, no distress, appears stated age  Head:   Normocephalic, without obvious abnormality, atraumatic  Eyes:   PERRL, conjunctiva/corneas clear, EOM's intact, fundi benign  Ears:   Normal TM's and external ear canals  Nose:  Nares normal, mucosa normal, no drainage or sinus tenderness  Throat:  Lips, mucosa, and tongue normal; teeth and gums normal  Neck:  Supple, no lymphadenopathy; thyroid: noenlargement/ tenderness/nodules; no carotid bruit or JVD  Back:  Spine nontender, no curvature, ROM normal, no CVAtenderness  Lungs:   Clear to auscultation bilaterally without wheezes, rales orronchi; respirations unlabored  Chest Wall:   No tenderness or deformity  Heart:   Regular rate and rhythm, S1 and S2 normal, no murmur, rub or gallop  Breast Exam:   no nipple discharge, inversion, skin dimpling, breast masses or axillary lymphadenopathy. She is diffusely tender, L>R, but no significant fibroglandular changes are noted (she is due for her cycle soon).    Abdomen:   Soft, non-tender, nondistended, normoactive bowel sounds,   no masses, no hepatosplenomegaly  Genitalia:   normal external genitalia without lesions. BUS and vagina normal, no abnormal  discharge.  No cervical motion tenderness. No uterine or adnexal tenderness or masses.  Pap not obtained.   Rectal:   normal sphincter tone, no mass. Heme negative brown stool.  Extremities:  No clubbing, cyanosis or edema. normal sensation to light touch. No varicose veins.  She has  point tenderness at proximal fibula, and slightly in the surrounding soft tissues. No edema or soft tissue swelling noted, no erythema, warmth.  Pulses:  2+ and symmetric all extremities  Skin:  Skin color, texture, turgor normal, no rashes or lesions  Lymph nodes:  Cervical, supraclavicular, and axillary nodes normal  Neurologic:  CNII-XII intact, normal strength, sensation and gait; reflexes 2+ and symmetric throughout  Psych: Normal mood, affect, hygiene and grooming   ASSESSMENT/PLAN:  Annual physical exam - Plan: POCT Urinalysis DIP (Proadvantage Device), VITAMIN D 25 Hydroxy (Vit-D Deficiency, Fractures), TSH, CBC with Differential/Platelet, Comprehensive metabolic panel  Vitamin D deficiency - noncompliant x 2 weeks, otherwise taking regularly. Due for recheck. Encouraged daily supplement - Plan: VITAMIN D 25 Hydroxy (Vit-D Deficiency, Fractures)  Hypothyroidism, unspecified type - stopped 25 mcg synthroid 4 weeks ago.  Has done well off meds in the past (only restarted due to increased hair loss, which may have been multifactorial) - Plan: TSH  Menstrual migraine without status migrainosus, not intractable - resolved/improved; not requiring meds, just OTC ibuprofen prn  Bilateral leg pain - seems to have muscular and tendinous component. Reassured regarding causes. Encouraged PT, compression socks (due to swelling with sitting) - Plan: CBC with Differential/Platelet, Comprehensive metabolic panel   D, TSH, c-met, CBC (lipids in future, when insurance changes)  Pain--superficial, suspect tendonitis--trial of regular NSAID x 1-2 weeks. rec PT given ongoing  pain/swelling, to get at cause, treat pain, and work on strengthening/stretching for prevention. Discussed compression socks, topical meds, proper shoewear.  Discussed monthly self breast exams and yearly mammograms; at least 30 minutes of aerobic activity at least 5 days/week, weight-bearing exercise 2x/wk; proper sunscreen use reviewed; healthy diet, including goals of calcium and vitamin D intake and alcohol recommendations (less than or equal to 1 drink/day) reviewed; regular seatbelt use; changing batteries in smoke detectors. Immunization recommendations discussed, UTD. Continue yearly flu shots. Shingrix at age 6.  Had to get catch-up immunizations for documentation for school (no childhood immunizations available). MMR immune. Varicella previously found to be immune. Colonoscopy recommendations reviewed, age 82   Your pain your legs seem to be muscular--tender to touch, and worse after a lot of walking. There may possibly be a vein component, since you swell with prolonged sitting. Compression socks are recommended. Topical medications like biofreeze, and anti-inflammatories like aleve or advil can help with your pain. The question is what to do to PREVENT it--I would recommend trial of physical therapy if you are still having discomfort after taking anti-inflammatory course for 10-14 days (Aleve twice daily (1-2 pills, with food, and cut back dose if it bothers your stomach) OR ibuprofen '600mg'$  (3 pills) three times daily with food).  If you have recurrent or continued pain, I recommend a course of physical therapy.  You can contact [facility] on your own (referral isn't needed by them, unless your insurance requires it).  Aart Schulenklopper and Jennelle Human are the therapists I recommend.

## 2017-10-06 ENCOUNTER — Encounter: Payer: Self-pay | Admitting: Family Medicine

## 2017-10-06 ENCOUNTER — Ambulatory Visit (INDEPENDENT_AMBULATORY_CARE_PROVIDER_SITE_OTHER): Payer: BLUE CROSS/BLUE SHIELD | Admitting: Family Medicine

## 2017-10-06 VITALS — BP 120/70 | HR 72 | Ht 61.0 in | Wt 145.0 lb

## 2017-10-06 DIAGNOSIS — Z0000 Encounter for general adult medical examination without abnormal findings: Secondary | ICD-10-CM

## 2017-10-06 DIAGNOSIS — M79604 Pain in right leg: Secondary | ICD-10-CM

## 2017-10-06 DIAGNOSIS — E039 Hypothyroidism, unspecified: Secondary | ICD-10-CM

## 2017-10-06 DIAGNOSIS — G43829 Menstrual migraine, not intractable, without status migrainosus: Secondary | ICD-10-CM | POA: Diagnosis not present

## 2017-10-06 DIAGNOSIS — E559 Vitamin D deficiency, unspecified: Secondary | ICD-10-CM | POA: Diagnosis not present

## 2017-10-06 DIAGNOSIS — M79605 Pain in left leg: Secondary | ICD-10-CM

## 2017-10-06 LAB — POCT URINALYSIS DIP (PROADVANTAGE DEVICE)
Bilirubin, UA: NEGATIVE
Blood, UA: NEGATIVE
Glucose, UA: NEGATIVE mg/dL
Ketones, POC UA: NEGATIVE mg/dL
Leukocytes, UA: NEGATIVE
Nitrite, UA: NEGATIVE
Protein Ur, POC: NEGATIVE mg/dL
Specific Gravity, Urine: 1.01
Urobilinogen, Ur: NEGATIVE
pH, UA: 6 (ref 5.0–8.0)

## 2017-10-06 NOTE — Patient Instructions (Addendum)
  HEALTH MAINTENANCE RECOMMENDATIONS:  It is recommended that you get at least 30 minutes of aerobic exercise at least 5 days/week (for weight loss, you may need as much as 60-90 minutes). This can be any activity that gets your heart rate up. This can be divided in 10-15 minute intervals if needed, but try and build up your endurance at least once a week.  Weight bearing exercise is also recommended twice weekly.  Eat a healthy diet with lots of vegetables, fruits and fiber.  "Colorful" foods have a lot of vitamins (ie green vegetables, tomatoes, red peppers, etc).  Limit sweet tea, regular sodas and alcoholic beverages, all of which has a lot of calories and sugar.  Up to 1 alcoholic drink daily may be beneficial for women (unless trying to lose weight, watch sugars).  Drink a lot of water.  Calcium recommendations are 1200-1500 mg daily (1500 mg for postmenopausal women or women without ovaries), and vitamin D 1000 IU daily.  This should be obtained from diet and/or supplements (vitamins), and calcium should not be taken all at once, but in divided doses.  Monthly self breast exams and yearly mammograms for women over the age of 95 is recommended.  Sunscreen of at least SPF 30 should be used on all sun-exposed parts of the skin when outside between the hours of 10 am and 4 pm (not just when at beach or pool, but even with exercise, golf, tennis, and yard work!)  Use a sunscreen that says "broad spectrum" so it covers both UVA and UVB rays, and make sure to reapply every 1-2 hours.  Remember to change the batteries in your smoke detectors when changing your clock times in the spring and fall.  I recommend a carbon monoxide detector for your home (you can buy at [place]).  Use your seat belt every time you are in a car, and please drive safely and not be distracted with cell phones and texting while driving.   Your pain your legs seem to be muscular--tender to touch, and worse after a lot of  walking. There may possibly be a vein component, since you swell with prolonged sitting. Compression socks are recommended. Topical medications like biofreeze, and anti-inflammatories like aleve or advil can help with your pain. The question is what to do to PREVENT it--I would recommend trial of physical therapy if you are still having discomfort after taking anti-inflammatory course for 10-14 days (Aleve twice daily (1-2 pills, with food, and cut back dose if it bothers your stomach) OR ibuprofen 600mg  (3 pills) three times daily with food).  If you have recurrent or continued pain, I recommend a course of physical therapy.  You can contact [facility] on your own (referral isn't needed by them, unless your insurance requires it).  Aart Schulenklopper and Jennelle Human are the therapists I recommend.

## 2017-10-07 LAB — CBC WITH DIFFERENTIAL/PLATELET
Basophils Absolute: 0 10*3/uL (ref 0.0–0.2)
Basos: 0 %
EOS (ABSOLUTE): 0.1 10*3/uL (ref 0.0–0.4)
Eos: 2 %
Hematocrit: 37.8 % (ref 34.0–46.6)
Hemoglobin: 12.8 g/dL (ref 11.1–15.9)
Immature Grans (Abs): 0 10*3/uL (ref 0.0–0.1)
Immature Granulocytes: 0 %
Lymphocytes Absolute: 2.4 10*3/uL (ref 0.7–3.1)
Lymphs: 35 %
MCH: 27.8 pg (ref 26.6–33.0)
MCHC: 33.9 g/dL (ref 31.5–35.7)
MCV: 82 fL (ref 79–97)
Monocytes Absolute: 0.5 10*3/uL (ref 0.1–0.9)
Monocytes: 7 %
Neutrophils Absolute: 3.9 10*3/uL (ref 1.4–7.0)
Neutrophils: 56 %
Platelets: 277 10*3/uL (ref 150–450)
RBC: 4.6 x10E6/uL (ref 3.77–5.28)
RDW: 13.7 % (ref 12.3–15.4)
WBC: 6.9 10*3/uL (ref 3.4–10.8)

## 2017-10-07 LAB — COMPREHENSIVE METABOLIC PANEL
ALT: 19 IU/L (ref 0–32)
AST: 19 IU/L (ref 0–40)
Albumin/Globulin Ratio: 1.7 (ref 1.2–2.2)
Albumin: 4.4 g/dL (ref 3.5–5.5)
Alkaline Phosphatase: 78 IU/L (ref 39–117)
BUN/Creatinine Ratio: 15 (ref 9–23)
BUN: 10 mg/dL (ref 6–24)
Bilirubin Total: 0.6 mg/dL (ref 0.0–1.2)
CO2: 23 mmol/L (ref 20–29)
Calcium: 9.2 mg/dL (ref 8.7–10.2)
Chloride: 103 mmol/L (ref 96–106)
Creatinine, Ser: 0.65 mg/dL (ref 0.57–1.00)
GFR calc Af Amer: 123 mL/min/{1.73_m2} (ref >59)
GFR calc non Af Amer: 107 mL/min/{1.73_m2} (ref >59)
Globulin, Total: 2.6 g/dL (ref 1.5–4.5)
Glucose: 107 mg/dL — ABNORMAL HIGH (ref 65–99)
Potassium: 4.4 mmol/L (ref 3.5–5.2)
Sodium: 138 mmol/L (ref 134–144)
Total Protein: 7 g/dL (ref 6.0–8.5)

## 2017-10-07 LAB — VITAMIN D 25 HYDROXY (VIT D DEFICIENCY, FRACTURES): Vit D, 25-Hydroxy: 20.4 ng/mL — ABNORMAL LOW (ref 30.0–100.0)

## 2017-10-07 LAB — TSH: TSH: 5.8 u[IU]/mL — ABNORMAL HIGH (ref 0.450–4.500)

## 2017-10-07 MED ORDER — VITAMIN D (ERGOCALCIFEROL) 1.25 MG (50000 UNIT) PO CAPS: 50000.0000 [IU] | ORAL_CAPSULE | ORAL | 0 refills | Status: DC

## 2017-10-07 NOTE — Addendum Note (Signed)
Addended by: Rita Ohara on: 10/07/2017 08:01 AM   Modules accepted: Orders

## 2017-10-10 ENCOUNTER — Encounter: Payer: Self-pay | Admitting: Family Medicine

## 2017-10-10 DIAGNOSIS — E559 Vitamin D deficiency, unspecified: Secondary | ICD-10-CM

## 2017-10-10 MED ORDER — VITAMIN D (ERGOCALCIFEROL) 1.25 MG (50000 UNIT) PO CAPS: 50000.0000 [IU] | ORAL_CAPSULE | ORAL | 0 refills | Status: DC

## 2018-04-11 ENCOUNTER — Other Ambulatory Visit: Payer: Self-pay | Admitting: Family Medicine

## 2018-04-11 DIAGNOSIS — Z1231 Encounter for screening mammogram for malignant neoplasm of breast: Secondary | ICD-10-CM

## 2018-06-16 ENCOUNTER — Ambulatory Visit: Payer: BLUE CROSS/BLUE SHIELD

## 2018-07-13 ENCOUNTER — Ambulatory Visit: Payer: Self-pay

## 2018-08-29 ENCOUNTER — Ambulatory Visit: Payer: Self-pay

## 2018-09-13 ENCOUNTER — Encounter: Payer: Self-pay | Admitting: Family Medicine

## 2018-09-13 ENCOUNTER — Other Ambulatory Visit: Payer: Self-pay | Admitting: Family Medicine

## 2018-09-13 DIAGNOSIS — E559 Vitamin D deficiency, unspecified: Secondary | ICD-10-CM

## 2018-10-10 ENCOUNTER — Encounter: Payer: Self-pay | Admitting: Family Medicine

## 2018-10-10 NOTE — Patient Instructions (Addendum)
  HEALTH MAINTENANCE RECOMMENDATIONS:  It is recommended that you get at least 30 minutes of aerobic exercise at least 5 days/week (for weight loss, you may need as much as 60-90 minutes). This can be any activity that gets your heart rate up. This can be divided in 10-15 minute intervals if needed, but try and build up your endurance at least once a week.  Weight bearing exercise is also recommended twice weekly.  Eat a healthy diet with lots of vegetables, fruits and fiber.  "Colorful" foods have a lot of vitamins (ie green vegetables, tomatoes, red peppers, etc).  Limit sweet tea, regular sodas and alcoholic beverages, all of which has a lot of calories and sugar.  Up to 1 alcoholic drink daily may be beneficial for women (unless trying to lose weight, watch sugars).  Drink a lot of water.  Calcium recommendations are 1200-1500 mg daily (1500 mg for postmenopausal women or women without ovaries), and vitamin D 1000 IU daily.  This should be obtained from diet and/or supplements (vitamins), and calcium should not be taken all at once, but in divided doses.  Monthly self breast exams and yearly mammograms for women over the age of 44 is recommended.  Sunscreen of at least SPF 30 should be used on all sun-exposed parts of the skin when outside between the hours of 10 am and 4 pm (not just when at beach or pool, but even with exercise, golf, tennis, and yard work!)  Use a sunscreen that says "broad spectrum" so it covers both UVA and UVB rays, and make sure to reapply every 1-2 hours.  Remember to change the batteries in your smoke detectors when changing your clock times in the spring and fall. Carbon monoxide detectors are recommended for your home.  Use your seat belt every time you are in a car, and please drive safely and not be distracted with cell phones and texting while driving.  Let us know if you're interested in having genetic testing, we would refer you to a genetics counselor at the  cancer center.

## 2018-10-10 NOTE — Progress Notes (Signed)
Chief Complaint  Patient presents with  . Annual Exam    fasting annual exam with pap (patient is on cycle though and will need to come back). No concerns.     Megan Torres is a 48 y.o. female who presents for a complete physical.  She has the following concerns:  She saw orthopedist in May due to knee pain, foot pain. ([facility] 08/2018)- diagnosed with pes anserine bursitis and medial tibial stress syndrome, mild B plantar fasciitis, mild flexible pes planus with pronation R>L.  She was prescribed mobic '15mg'$ , and referred for physical therapy (for pes anserine bursitis and shin splints). She went x 6 sessions, and symptoms resolved. Pain had started after she changed from doing [place] to walking 4-6 miles/day. She has h/o shin splints in the past, still uses orthotics from [facility].  She previously complained of her calves can feel heavy, weak, burning. She tried wearing compression socks, which helped. Sometimes still has some burning in her calves with a lot of standing, like with cooking.  Menstrual migraines:  These resolved, no longer having migraines with cycles.  She only took imitrex once since our last visit.  She would like a refill to have on hand.  She has h/o hypothyroidism--had been off meds for many years (stopped 2011), previously monitored by Dr. Dwyane Torres.  Started back on meds during evaluation for infertility, for 6 months, stopped in 12/2015.  TSH was 4.35 in 09/2016; she called in July 2018 asking to restart thyroid medication, due to hair loss.  She restarted Synthroid 27mg daily, and recheck TSH in August was: Recent Labs       Lab Results  Component Value Date   TSH 2.53 12/02/2016    She stopped taking Synthroid 4 weeks prior to her last physical. She has remained off the medication.  Denies any change in hair/skin/bowels/energy/weight  Lab Results  Component Value Date   TSH 5.800 (H) 10/06/2017   Vitamin D deficiency--Last level was low at 20.4 in  09/2017. At that time she was taking 2000 IU daily after completing a prescription course, but hadn't had any OTC D3 for 2 weeks prior to her lab.  Level prior was 13 in 09/2016.  She hasn't taken any vitamin D in the last 2 months, had been taking 2000 IU daily prior to that.  Immunization History  Administered Date(s) Administered  . Influenza Split 02/01/2014  . Influenza, Seasonal, Injecte, Preservative Fre 06/08/2012  . Influenza,inj,Quad PF,6+ Mos 12/02/2016  . Tdap 06/08/2012, 09/27/2016, 03/31/2017  gets flu shots at work (had last in 02/2018) Last Pap smear:09/2016, normal, no high risk HPV present Last mammogram:06/2017 (also requiring diagnostic mammo and [place]oreaon the left, benign); scheduled for 10/16/18 Last colonoscopy: never Last DEXA: never Dentist: every 3 months  Ophtho: yearly Exercise: Walks 2x/week (cut down to 3 miles from 4-6 after issues with knee and shin splints).  Yardwork, gardening.  Previously was doing [employer]week, plans to restart in August.  Past Medical History:  Diagnosis Date  . Family history of breast cancer in mother   . Family history of breast cancer in mother    she was never tested for BRCA  . Family history of ovarian cancer   . Thyroid disease    HYPOTHYROID, h/o hyperthyroidism.  off meds since 8/09  (Dr. KDwyane Torres  . Vitamin D deficiency 2010    Past Surgical History:  Procedure Laterality Date  . LIPOMA EXCISION  1/08   fatty  tumor removed from L axilla (in [place])    Social History   Socioeconomic History  . Marital status: Married    Spouse name: Not on file  . Number of children: Not on file  . Years of education: Not on file  . Highest education level: Not on file  Occupational History  . Not on file  Social Needs  . Financial resource strain: Not on file  . Food insecurity    Worry: Not on file    Inability: Not on file  . Transportation needs    Medical: Not on file    Non-medical: Not on file  Tobacco Use  .  Smoking status: Never Smoker  . Smokeless tobacco: Never Used  Substance and Sexual Activity  . Alcohol use: Not Currently    Alcohol/week: 0.0 standard drinks    Comment: rare (1-2x/year)  . Drug use: No  . Sexual activity: Yes    Partners: Male  Lifestyle  . Physical activity    Days per week: Not on file    Minutes per session: Not on file  . Stress: Not on file  Relationships  . Social Herbalist on phone: Not on file    Gets together: Not on file    Attends religious service: Not on file    Active member of club or organization: Not on file    Attends meetings of clubs or organizations: Not on file    Relationship status: Not on file  . Intimate partner violence    Fear of current or ex partner: Not on file    Emotionally abused: Not on file    Physically abused: Not on file    Forced sexual activity: Not on file  Other Topics Concern  . Not on file  Social History Narrative   Immigrated from [place] in 2003. Divorced and re-married.  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    Got her Masters in Press photographer at [employer] (1 year program, graduated 08/2017)      Lives with husband, 1 dog ([name], [place])   Works at [employer], working as Passenger transport manager, in [place].        Updated 09/2018    Family History  Problem Relation Age of Onset  . Breast cancer Mother 29  . Hypothyroidism Mother   . Ovarian cancer Maternal Aunt   . Diabetes Neg Hx   . Heart disease Neg Hx     Outpatient Encounter Medications as of 10/12/2018  Medication Sig Note  . SUMAtriptan (IMITREX) 50 MG tablet Take 1 tablet at onset of migraine. May repeat once in 2 hours (max '100mg'$ /24 hours)   . [DISCONTINUED] Cholecalciferol (VITAMIN D) 2000 units tablet Take 2,000 Units by mouth daily. 10/06/2017: Hasn't taken in 2 weeks  . [DISCONTINUED] meloxicam (MOBIC) 15 MG tablet TK 1 T PO ONCE D   . [DISCONTINUED] SUMAtriptan  (IMITREX) 50 MG tablet Take 1 tablet at onset of migraine. May repeat once in 2 hours (max '100mg'$ /24 hours) (Patient not taking: Reported on 10/06/2017)   . [DISCONTINUED] SYNTHROID 25 MCG tablet Take 1 tablet (25 mcg total) by mouth daily before breakfast. (Patient not taking: Reported on 10/06/2017) 10/06/2017: Has not taken in one month  . [DISCONTINUED] Vitamin D, Ergocalciferol, (DRISDOL) 50000 units CAPS capsule Take 1 capsule (50,000 Units total) by mouth every 7 (seven) days.    No facility-administered encounter medications on file as of 10/12/2018.  No Known Allergies   ROS: The patient denies anorexia, fever, headaches (just rare), vision changes, decreased hearing, ear pain, sore throat, breast concerns, chest pain, palpitations, dizziness, syncope, dyspnea on exertion, cough, swelling, nausea, vomiting, diarrhea, constipation, abdominal pain, melena, hematochezia, indigestion/heartburn, hematuria, incontinence, dysuria, irregular menstrual cycles, vaginal discharge, odor or itch, genital lesions, joint pains, numbness, tingling, weakness, tremor, suspicious skin lesions, depression, anxiety, abnormal bleeding/bruising, or enlarged lymph nodes. Denies thyroid symptoms--no changes in energy, weight, hair/skin/nails/bowels or moods. Knee pain and foot pain per HPI, improved with NSAID, PT.   PHYSICAL EXAM:  BP 110/60   Pulse 80   Temp 98.4 F (36.9 C) (Temporal)   Ht 5' 0.5" (1.537 m)   Wt 143 lb 12.8 oz (65.2 kg)   LMP 09/11/2018   BMI 27.62 kg/m   Wt Readings from Last 3 Encounters:  10/12/18 143 lb 12.8 oz (65.2 kg)  10/06/17 145 lb (65.8 kg)  04/13/17 142 lb 9.6 oz (64.7 kg)    General Appearance:   Alert, cooperative, no distress, appears stated age  Head:   Normocephalic, without obvious abnormality, atraumatic  Eyes:   PERRL, conjunctiva/corneas clear, EOM's intact, fundi benign  Ears:   Normal TM's and external ear canals  Nose:  Not examined  (wearing mask due to COVID-19 pandemic)  Throat:  Not examined (wearing mask due to COVID-19 pandemic)  Neck:  Supple, no lymphadenopathy; thyroid:borderline size, no tenderness/nodules; no carotid bruit or JVD  Back:  Spine nontender, no curvature, ROM normal, no CVAtenderness  Lungs:   Clear to auscultation bilaterally without wheezes, rales orronchi; respirations unlabored  Chest Wall:   No tenderness or deformity  Heart:   Regular rate and rhythm, S1 and S2 normal, no murmur, rub or gallop  Breast Exam:   no nipple discharge, inversion, skin dimpling, breast masses or axillary lymphadenopathy.Exam of axilla was only very brief/limited due to being ticklish. She is diffusely tender in her breasts, inferiorly and slightly in upper outer quadrants, left a little more tender than on right.  No masses appreciable. She is currently on her cycle.   Abdomen:   Soft, non-tender, nondistended, normoactive bowel sounds,   no masses, no hepatosplenomegaly  Genitalia:  Deferred due to pt being on cycle   Rectal:  Exam deferred  Extremities:  No clubbing, cyanosis or edema.   Pulses:  2+ and symmetric all extremities  Skin:  Skin color, texture, turgor normal, no rashes or lesions  Lymph nodes:  Cervical, supraclavicular, and axillary nodes normal  Neurologic:  CNII-XII intact, normal strength, sensation and gait; reflexes 2+ and symmetric throughout  Psych: Normal mood, affect, hygiene and grooming  ASSESSMENT/PLAN:  Annual physical exam - Plan: CBC with Differential/Platelet, VITAMIN D 25 Hydroxy (Vit-D Deficiency, Fractures), TSH, Lipid panel, Comprehensive metabolic panel  Hypothyroidism, unspecified type - not on treatment, has been borderline. Prefers to remain off meds (due to lack of sx) if TSH okay (discussed this being <10) - Plan: TSH  Vitamin D deficiency - suspect will be low due to noncompliance with OTC supplement  - Plan: VITAMIN D 25 Hydroxy (Vit-D Deficiency, Fractures)  Menstrual migraine without status migrainosus, not intractable - infrequent. Refilled imitrex to have on hand for prn use - Plan: SUMAtriptan (IMITREX) 50 MG tablet   fglu (vs c-met), CBC, lipids TSH, vit D   Discussed monthly self breast exams and yearly mammograms; at least 30 minutes of aerobic activity at least 5 days/week, weight-bearing exercise 2x/wk; proper sunscreen use reviewed; healthy diet,  including goals of calcium and vitamin D intake and alcohol recommendations (less than or equal to 1 drink/day) reviewed; regular seatbelt use; changing batteries in smoke detectors. Immunization recommendations discussed, UTD. Continue yearly flu shots. Shingrix at age 84. Had to get catch-up immunizations for documentation for school (no childhood immunizations available). MMR immune. Varicella previously found to be immune. Colonoscopy recommendations reviewed, age 79 (sooner if guidelines/coverage changes)  Will consider whether or not she is interested in genetic testing (mother was not tested for BRCA gene)

## 2018-10-12 ENCOUNTER — Other Ambulatory Visit: Payer: Self-pay

## 2018-10-12 ENCOUNTER — Encounter: Payer: Self-pay | Admitting: Family Medicine

## 2018-10-12 ENCOUNTER — Ambulatory Visit (INDEPENDENT_AMBULATORY_CARE_PROVIDER_SITE_OTHER): Payer: BC Managed Care – PPO | Admitting: Family Medicine

## 2018-10-12 VITALS — BP 110/60 | HR 80 | Temp 98.4°F | Ht 60.5 in | Wt 143.8 lb

## 2018-10-12 DIAGNOSIS — Z0000 Encounter for general adult medical examination without abnormal findings: Secondary | ICD-10-CM

## 2018-10-12 DIAGNOSIS — G43829 Menstrual migraine, not intractable, without status migrainosus: Secondary | ICD-10-CM

## 2018-10-12 DIAGNOSIS — E559 Vitamin D deficiency, unspecified: Secondary | ICD-10-CM | POA: Diagnosis not present

## 2018-10-12 DIAGNOSIS — E039 Hypothyroidism, unspecified: Secondary | ICD-10-CM | POA: Diagnosis not present

## 2018-10-12 MED ORDER — SUMATRIPTAN SUCCINATE 50 MG PO TABS: ORAL_TABLET | ORAL | 0 refills | Status: DC

## 2018-10-13 LAB — CBC WITH DIFFERENTIAL/PLATELET
Basophils Absolute: 0 10*3/uL (ref 0.0–0.2)
Basos: 0 %
EOS (ABSOLUTE): 0.1 10*3/uL (ref 0.0–0.4)
Eos: 2 %
Hematocrit: 35.9 % (ref 34.0–46.6)
Hemoglobin: 12.1 g/dL (ref 11.1–15.9)
Immature Grans (Abs): 0 10*3/uL (ref 0.0–0.1)
Immature Granulocytes: 0 %
Lymphocytes Absolute: 2.3 10*3/uL (ref 0.7–3.1)
Lymphs: 40 %
MCH: 27.9 pg (ref 26.6–33.0)
MCHC: 33.7 g/dL (ref 31.5–35.7)
MCV: 83 fL (ref 79–97)
Monocytes Absolute: 0.4 10*3/uL (ref 0.1–0.9)
Monocytes: 7 %
Neutrophils Absolute: 3 10*3/uL (ref 1.4–7.0)
Neutrophils: 51 %
Platelets: 280 10*3/uL (ref 150–450)
RBC: 4.34 x10E6/uL (ref 3.77–5.28)
RDW: 12.3 % (ref 11.7–15.4)
WBC: 5.9 10*3/uL (ref 3.4–10.8)

## 2018-10-13 LAB — COMPREHENSIVE METABOLIC PANEL
ALT: 10 IU/L (ref 0–32)
AST: 12 IU/L (ref 0–40)
Albumin/Globulin Ratio: 1.7 (ref 1.2–2.2)
Albumin: 4.5 g/dL (ref 3.8–4.8)
Alkaline Phosphatase: 79 IU/L (ref 39–117)
BUN/Creatinine Ratio: 15 (ref 9–23)
BUN: 10 mg/dL (ref 6–24)
Bilirubin Total: 0.4 mg/dL (ref 0.0–1.2)
CO2: 19 mmol/L — ABNORMAL LOW (ref 20–29)
Calcium: 9.3 mg/dL (ref 8.7–10.2)
Chloride: 104 mmol/L (ref 96–106)
Creatinine, Ser: 0.67 mg/dL (ref 0.57–1.00)
GFR calc Af Amer: 121 mL/min/{1.73_m2} (ref >59)
GFR calc non Af Amer: 105 mL/min/{1.73_m2} (ref >59)
Globulin, Total: 2.6 g/dL (ref 1.5–4.5)
Glucose: 107 mg/dL — ABNORMAL HIGH (ref 65–99)
Potassium: 4.1 mmol/L (ref 3.5–5.2)
Sodium: 139 mmol/L (ref 134–144)
Total Protein: 7.1 g/dL (ref 6.0–8.5)

## 2018-10-13 LAB — LIPID PANEL
Chol/HDL Ratio: 4.1 ratio (ref 0.0–4.4)
Cholesterol, Total: 172 mg/dL (ref 100–199)
HDL: 42 mg/dL (ref >39)
LDL Calculated: 112 mg/dL — ABNORMAL HIGH (ref 0–99)
Triglycerides: 92 mg/dL (ref 0–149)
VLDL Cholesterol Cal: 18 mg/dL (ref 5–40)

## 2018-10-13 LAB — VITAMIN D 25 HYDROXY (VIT D DEFICIENCY, FRACTURES): Vit D, 25-Hydroxy: 16 ng/mL — ABNORMAL LOW (ref 30.0–100.0)

## 2018-10-13 LAB — TSH: TSH: 6.03 u[IU]/mL — ABNORMAL HIGH (ref 0.450–4.500)

## 2018-10-13 MED ORDER — VITAMIN D (ERGOCALCIFEROL) 1.25 MG (50000 UNIT) PO CAPS: 50000.0000 [IU] | ORAL_CAPSULE | ORAL | 0 refills | Status: DC

## 2018-10-13 NOTE — Addendum Note (Signed)
Addended by: Rita Ohara on: 10/13/2018 09:52 AM   Modules accepted: Orders

## 2018-10-16 ENCOUNTER — Ambulatory Visit
Admission: RE | Admit: 2018-10-16 | Discharge: 2018-10-16 | Disposition: A | Discharge status: Home / Self Care | Payer: BC Managed Care – PPO | Source: Ambulatory Visit | Attending: Family Medicine | Admitting: Family Medicine

## 2018-10-16 DIAGNOSIS — Z1231 Encounter for screening mammogram for malignant neoplasm of breast: Secondary | ICD-10-CM

## 2019-03-01 ENCOUNTER — Encounter: Payer: Self-pay | Admitting: Family Medicine

## 2019-03-01 DIAGNOSIS — M62838 Other muscle spasm: Secondary | ICD-10-CM

## 2019-03-01 MED ORDER — METAXALONE 800 MG PO TABS: 400.0000 mg | ORAL_TABLET | Freq: Three times a day (TID) | ORAL | 0 refills | Status: DC | PRN

## 2019-03-01 MED ORDER — MELOXICAM 15 MG PO TABS: 15.0000 mg | ORAL_TABLET | Freq: Every day | ORAL | 0 refills | Status: DC | PRN

## 2019-06-22 ENCOUNTER — Other Ambulatory Visit: Payer: Self-pay | Admitting: Family Medicine

## 2019-06-22 DIAGNOSIS — Z1231 Encounter for screening mammogram for malignant neoplasm of breast: Secondary | ICD-10-CM

## 2019-10-17 ENCOUNTER — Ambulatory Visit
Admission: RE | Admit: 2019-10-17 | Discharge: 2019-10-17 | Disposition: A | Discharge status: Home / Self Care | Payer: BC Managed Care – PPO | Source: Ambulatory Visit | Attending: Family Medicine | Admitting: Family Medicine

## 2019-10-17 ENCOUNTER — Other Ambulatory Visit: Payer: Self-pay

## 2019-10-17 DIAGNOSIS — Z1231 Encounter for screening mammogram for malignant neoplasm of breast: Secondary | ICD-10-CM

## 2019-10-22 NOTE — Patient Instructions (Addendum)
HEALTH MAINTENANCE RECOMMENDATIONS:  It is recommended that you get at least 30 minutes of aerobic exercise at least 5 days/week (for weight loss, you may need as much as 60-90 minutes). This can be any activity that gets your heart rate up. This can be divided in 10-15 minute intervals if needed, but try and build up your endurance at least once a week.  Weight bearing exercise is also recommended twice weekly.  Eat a healthy diet with lots of vegetables, fruits and fiber.  "Colorful" foods have a lot of vitamins (ie green vegetables, tomatoes, red peppers, etc).  Limit sweet tea, regular sodas and alcoholic beverages, all of which has a lot of calories and sugar.  Up to 1 alcoholic drink daily may be beneficial for women (unless trying to lose weight, watch sugars).  Drink a lot of water.  Calcium recommendations are 1200-1500 mg daily (1500 mg for postmenopausal women or women without ovaries), and vitamin D 1000 IU daily.  This should be obtained from diet and/or supplements (vitamins), and calcium should not be taken all at once, but in divided doses.  Monthly self breast exams and yearly mammograms for women over the age of 58 is recommended.  Sunscreen of at least SPF 30 should be used on all sun-exposed parts of the skin when outside between the hours of 10 am and 4 pm (not just when at beach or pool, but even with exercise, golf, tennis, and yard work!)  Use a sunscreen that says "broad spectrum" so it covers both UVA and UVB rays, and make sure to reapply every 1-2 hours.  Remember to change the batteries in your smoke detectors when changing your clock times in the spring and fall. Carbon monoxide detectors are recommended for your home.  Use your seat belt every time you are in a car, and please drive safely and not be distracted with cell phones and texting while driving.  Colon cancer screening is recommended now. We discussed options including Cologuard and Colonoscopy.  Counseling  is recommended--consider either grief counseling through hospice, or therapy through your EAP. Be sure to put YOU first, and not your work.  You can use a heating pad on the chest to help with some of the discomfort.  Your blood pressure was elevated. If you have the opportunity to check your blood pressure elsewhere, that would be helpful. Continuing regular exercise, stress reduction and limiting the salt in the diet can help keep the blood pressure down (goal is 130/80 or less.   DASH Eating Plan DASH stands for "Dietary Approaches to Stop Hypertension." The DASH eating plan is a healthy eating plan that has been shown to reduce high blood pressure (hypertension). It may also reduce your risk for type 2 diabetes, heart disease, and stroke. The DASH eating plan may also help with weight loss. What are tips for following this plan?  General guidelines  Avoid eating more than 2,300 mg (milligrams) of salt (sodium) a day. If you have hypertension, you may need to reduce your sodium intake to 1,500 mg a day.  Limit alcohol intake to no more than 1 drink a day for nonpregnant women and 2 drinks a day for men. One drink equals 12 oz of beer, 5 oz of wine, or 1 oz of hard liquor.  Work with your health care provider to maintain a healthy body weight or to lose weight. Ask what an ideal weight is for you.  Get at least 30 minutes of exercise that  causes your heart to beat faster (aerobic exercise) most days of the week. Activities may include walking, swimming, or biking.  Work with your health care provider or diet and nutrition specialist (dietitian) to adjust your eating plan to your individual calorie needs. Reading food labels   Check food labels for the amount of sodium per serving. Choose foods with less than 5 percent of the Daily Value of sodium. Generally, foods with less than 300 mg of sodium per serving fit into this eating plan.  To find whole grains, look for the word "whole" as  the first word in the ingredient list. Shopping  Buy products labeled as "low-sodium" or "no salt added."  Buy fresh foods. Avoid canned foods and premade or frozen meals. Cooking  Avoid adding salt when cooking. Use salt-free seasonings or herbs instead of table salt or sea salt. Check with your health care provider or pharmacist before using salt substitutes.  Do not fry foods. Cook foods using healthy methods such as baking, boiling, grilling, and broiling instead.  Cook with heart-healthy oils, such as olive, canola, soybean, or sunflower oil. Meal planning  Eat a balanced diet that includes: ? 5 or more servings of fruits and vegetables each day. At each meal, try to fill half of your plate with fruits and vegetables. ? Up to 6-8 servings of whole grains each day. ? Less than 6 oz of lean meat, poultry, or fish each day. A 3-oz serving of meat is about the same size as a deck of cards. One egg equals 1 oz. ? 2 servings of low-fat dairy each day. ? A serving of nuts, seeds, or beans 5 times each week. ? Heart-healthy fats. Healthy fats called Omega-3 fatty acids are found in foods such as flaxseeds and coldwater fish, like sardines, salmon, and mackerel.  Limit how much you eat of the following: ? Canned or prepackaged foods. ? Food that is high in trans fat, such as fried foods. ? Food that is high in saturated fat, such as fatty meat. ? Sweets, desserts, sugary drinks, and other foods with added sugar. ? Full-fat dairy products.  Do not salt foods before eating.  Try to eat at least 2 vegetarian meals each week.  Eat more home-cooked food and less restaurant, buffet, and fast food.  When eating at a restaurant, ask that your food be prepared with less salt or no salt, if possible. What foods are recommended? The items listed may not be a complete list. Talk with your dietitian about what dietary choices are best for you. Grains Whole-grain or whole-wheat bread.  Whole-grain or whole-wheat pasta. Brown rice. [place]. Bulgur. Whole-grain and low-sodium cereals. Pita bread. Low-fat, low-sodium crackers. Whole-wheat flour tortillas. Vegetables Fresh or frozen vegetables (raw, steamed, roasted, or grilled). Low-sodium or reduced-sodium tomato and vegetable juice. Low-sodium or reduced-sodium tomato sauce and tomato paste. Low-sodium or reduced-sodium canned vegetables. Fruits All fresh, dried, or frozen fruit. Canned fruit in natural juice (without added sugar). Meat and other protein foods Skinless chicken or [place]. Ground chicken or [place]. Pork with fat trimmed off. Fish and seafood. Egg whites. Dried beans, peas, or lentils. Unsalted nuts, nut butters, and seeds. Unsalted canned beans. Lean cuts of beef with fat trimmed off. Low-sodium, lean deli meat. Dairy Low-fat (1%) or fat-free (skim) milk. Fat-free, low-fat, or reduced-fat cheeses. Nonfat, low-sodium ricotta or cottage cheese. Low-fat or nonfat yogurt. Low-fat, low-sodium cheese. Fats and oils Soft margarine without trans fats. Vegetable oil. Low-fat, reduced-fat, or light  mayonnaise and salad dressings (reduced-sodium). Canola, safflower, olive, soybean, and sunflower oils. Avocado. Seasoning and other foods Herbs. Spices. Seasoning mixes without salt. Unsalted popcorn and pretzels. Fat-free sweets. What foods are not recommended? The items listed may not be a complete list. Talk with your dietitian about what dietary choices are best for you. Grains Baked goods made with fat, such as croissants, muffins, or some breads. Dry pasta or rice meal packs. Vegetables Creamed or fried vegetables. Vegetables in a cheese sauce. Regular canned vegetables (not low-sodium or reduced-sodium). Regular canned tomato sauce and paste (not low-sodium or reduced-sodium). Regular tomato and vegetable juice (not low-sodium or reduced-sodium). Angie Fava. Olives. Fruits Canned fruit in a light or heavy syrup.  Fried fruit. Fruit in cream or butter sauce. Meat and other protein foods Fatty cuts of meat. Ribs. Fried meat. Berniece Salines. Sausage. Bologna and other processed lunch meats. Salami. Fatback. Hotdogs. Bratwurst. Salted nuts and seeds. Canned beans with added salt. Canned or smoked fish. Whole eggs or egg yolks. Chicken or [place] with skin. Dairy Whole or 2% milk, cream, and half-and-half. Whole or full-fat cream cheese. Whole-fat or sweetened yogurt. Full-fat cheese. Nondairy creamers. Whipped toppings. Processed cheese and cheese spreads. Fats and oils Butter. Stick margarine. Lard. Shortening. Ghee. Bacon fat. Tropical oils, such as coconut, palm kernel, or palm oil. Seasoning and other foods Salted popcorn and pretzels. Onion salt, garlic salt, seasoned salt, table salt, and sea salt. Worcestershire sauce. Tartar sauce. Barbecue sauce. Teriyaki sauce. Soy sauce, including reduced-sodium. Steak sauce. Canned and packaged gravies. Fish sauce. Oyster sauce. Cocktail sauce. Horseradish that you find on the shelf. Ketchup. Mustard. Meat flavorings and tenderizers. Bouillon cubes. Hot sauce and Tabasco sauce. Premade or packaged marinades. Premade or packaged taco seasonings. Relishes. Regular salad dressings. Where to find more information:  National Heart, Lung, and [place]: https://wilson-eaton.com/  American Heart Association: www.heart.org Summary  The DASH eating plan is a healthy eating plan that has been shown to reduce high blood pressure (hypertension). It may also reduce your risk for type 2 diabetes, heart disease, and stroke.  With the DASH eating plan, you should limit salt (sodium) intake to 2,300 mg a day. If you have hypertension, you may need to reduce your sodium intake to 1,500 mg a day.  When on the DASH eating plan, aim to eat more fresh fruits and vegetables, whole grains, lean proteins, low-fat dairy, and heart-healthy fats.  Work with your health care provider or diet and  nutrition specialist (dietitian) to adjust your eating plan to your individual calorie needs. This information is not intended to replace advice given to you by your health care provider. Make sure you discuss any questions you have with your health care provider. Document Revised: 03/11/2017 Document Reviewed: 03/22/2016 Elsevier Patient Education  2020 [employer].

## 2019-10-22 NOTE — Progress Notes (Addendum)
Chief Complaint  Patient presents with  . other    fasting CPE   Megan Torres Payor is a 49 y.o. female who presents for a complete physical.  She has the following concerns:  She has h/o hypothyroidism--had beenoff medsfor many years(stopped 2011), previously monitored byDr. Dwyane Dee. Started back on meds during evaluation for infertility, for 6 months, stopped in 12/2015. TSH was 4.35 in 09/2016; she called in July 2018 asking to restart thyroid medication, due to hair loss. She restarted Synthroid 74mg daily, and recheck TSH in August 2018 was 2.53.  She had stopped taking Synthroid again 2 years ago. TSH has remained mildly elevated, but asymptomatic and declined treatment (subclinical hypothyroidism).  She has h/o +TPO antibodies. Lab Results  Component Value Date   TSH 6.030 (H) 10/12/2018   Today she reports that she has had a little bit of weight gain, and isn't able to ever lose any. Feet often feel hot (not a burning pain, just temperature, needs to kick her feet out from the covers), no cold intolerance. No changes to hair/skin or nails.  Loses hair all the time, but not thinning. Feeling more depressed since many losses (family, friends, close family friends) due to [place] Parents and sister all had COVID, but recovered.  [place]  Was very sad for a few weeks.  Never really cried or addressed it, hasn't dealt with it. Has been busy at work.  Didn't like how she felt on antidepressants after divorce years ago (only took x 1 week, felt like a zombie). Hesitant to take medications.  Noted to have impaired fasting glucose (sugars 107 fasting the last 2 years). She tries to limit the sugar/sweets/carbs in her diet. Having more ice in the last 3 weeks, small portions.  Vitamin D deficiency--Last level was low at 16 in 10/2018, when she hadn't taken any vitamin D x 2 months (and took 2000 IU daily prior to that).  Level had been 20.4 in 09/2017.  She  was prescribed 12 weeks of replacement in 10/2018, and advised to take 2000 IU of D3 daily, long-term.  She is currently taking 2000 IU daily.  H/o Menstrual migraines:  These resolved, no longer having migraines with cycles. She still occasionally gets a migraine (had one 2 weeks ago, and another 3 months ago). She used naproxen '500mg'$  and that was effective.  Previously prescribed Imitrex, hasn't used.   Immunization History  Administered Date(s) Administered  . Influenza Split 02/01/2014  . Influenza, Seasonal, Injecte, Preservative Fre 06/08/2012  . Influenza,inj,Quad PF,6+ Mos 12/02/2016  . PFIZER SARS-COV-2 Vaccination 06/20/2019, 07/11/2019  . Tdap 06/08/2012, 09/27/2016, 03/31/2017   MMR immune. Varicella previously found to be immune. Last Pap smear:09/2016, normal, no high risk HPV present Last mammogram:10/2019 Last colonoscopy: never Last DEXA: never Dentist: every 3 months  Ophtho: yearly Exercise: 45 minutes 3-4x/week ([facility], though none in the last 2 weeks due to feeling down and working a lot.  Lipids: Lab Results  Component Value Date   CHOL 172 10/12/2018   HDL 42 10/12/2018   LDLCALC 112 (H) 10/12/2018   TRIG 92 10/12/2018   CHOLHDL 4.1 10/12/2018   Wants this rechecked today.  PMH, PSH, SH and FH were reviewed and updated  Outpatient Encounter Medications as of 10/24/2019  Medication Sig  . VITAMIN D PO Take 2,000 Units by mouth.  . [DISCONTINUED] SUMAtriptan (IMITREX) 50 MG tablet Take 1 tablet at onset of migraine. May repeat once  in 2 hours (max '100mg'$ /24 hours)  . [DISCONTINUED] meloxicam (MOBIC) 15 MG tablet Take 1 tablet (15 mg total) by mouth daily as needed for pain. (Patient not taking: Reported on 10/24/2019)  . [DISCONTINUED] metaxalone (SKELAXIN) 800 MG tablet Take 0.5-1 tablets (400-800 mg total) by mouth 3 (three) times daily as needed for muscle spasms. (Patient not taking: Reported on 10/24/2019)  . [DISCONTINUED] Vitamin D,  Ergocalciferol, (DRISDOL) 1.25 MG (50000 UT) CAPS capsule Take 1 capsule (50,000 Units total) by mouth every 7 (seven) days. (Patient not taking: Reported on 10/24/2019)   No facility-administered encounter medications on file as of 10/24/2019.   Takes naproxen '500mg'$  prn for migraines (still has left from rx from here from 04/2017)  No Known Allergies  ROS: The patient denies anorexia, fever, headaches (just occasional, per HPI, r/b naproxen), vision changes, decreased hearing, ear pain, sore throat, breast concerns, chest pain, palpitations, dizziness, syncope, dyspnea on exertion, cough, swelling, nausea, vomiting, diarrhea, constipation, abdominal pain, melena, hematochezia, indigestion/heartburn, hematuria, incontinence, dysuria, irregular menstrual cycles, vaginal discharge, odor or itch, genital lesions, joint pains, numbness, tingling, weakness, tremor, suspicious skin lesions, anxiety, abnormal bleeding/bruising, or enlarged lymph nodes. Denies joint pains. Occasional headaches (with associated nausea), per HPI. No longer has any burning in her calves. Depressive symptoms x 3 weeks. No changes to hair/skin/nails/bowels. Unable to lose weight, slight gain.   PHYSICAL EXAM:  BP (!) 144/86   Pulse 81   Temp 98.6 F (37 C)   Ht 5' 0.5" (1.537 m)   Wt 147 lb (66.7 kg)   LMP 10/13/2019   SpO2 98%   BMI 28.24 kg/m   161 systolic on repeat by MD. Pt upset in discussing deaths due to [place] and her depression.  Wt Readings from Last 3 Encounters:  10/24/19 147 lb (66.7 kg)  10/12/18 143 lb 12.8 oz (65.2 kg)  10/06/17 145 lb (65.8 kg)    General Appearance:   Alert, cooperative, no distress, appears stated age  Head:   Normocephalic, without obvious abnormality, atraumatic  Eyes:   PERRL, conjunctiva/corneas clear, EOM's intact, fundi benign  Ears:   Normal TM's and external ear canals  Nose:  Not examined (wearing mask due to COVID-19 pandemic)  Throat:  Not  examined (wearing mask due to COVID-19 pandemic)  Neck:  Supple, no lymphadenopathy; thyroid:borderline size, no  tenderness/nodules; no carotid bruit or JVD  Back:  Spine nontender, no curvature, ROM normal, no CVAtenderness  Lungs:   Clear to auscultation bilaterally without wheezes, rales orronchi; respirations unlabored  Chest Wall:   No deformity. Has some focal tenderness at a few levels at costochondral junctions bilaterally  Heart:   Regular rate and rhythm, S1 and S2 normal, no murmur, rub or gallop  Breast Exam:  No nipple discharge, inversion, skin dimpling, breast masses or axillary lymphadenopathy. She is diffusely tender in her breasts, bilaterally inferiorly and laterally, not in the UOQ.  Also has some tenderness medially, but has separate focal tenderness at costochondral junctions bilaterally. No fibroglandular changes were appreciable on exam, just tenderness. No axillary lymphadenopathy  Abdomen:   Soft, non-tender, nondistended, normoactive bowel sounds,   no masses, no hepatosplenomegaly  Genitalia:  External genitalia is normal without lesions. BUS/vagina normal, no abnormal discharge.  Uterus is normal in size, no adnexal masses or tenderness.  No cervical motion tenderness.  Pap not performed  Rectal:  Normal sphincter tone, no masses.  Heme negative stool in vault  Extremities:  No clubbing, cyanosis or edema.  Pulses:  2+ and symmetric all extremities  Skin:  Skin color, texture, turgor normal, no rashes or lesions  Lymph nodes:  Cervical, supraclavicular, and axillary nodes normal  Neurologic:  Normal strength, sensation and gait; reflexes 2+ and symmetric throughout  Psych: Depressed mood, full range of affect, normal hygiene and grooming  PHQ-9 score of 9, but only having symptoms x 3 weeks.  Normal urine dip   ASSESSMENT/PLAN:  Annual physical exam - Plan: POCT Urinalysis DIP (Proadvantage  Device), CBC with Differential/Platelet, Comprehensive metabolic panel, Lipid panel, VITAMIN D 25 Hydroxy (Vit-D Deficiency, Fractures), TSH, Hemoglobin A1c  Hypothyroidism, unspecified type - previously subclinical; some trouble losing weight and worsening moods, but +grief. Interested in restarting Synthroid; await labs - Plan: TSH  Vitamin D deficiency - Due for recheck. Compliant with supplements - Plan: VITAMIN D 25 Hydroxy (Vit-D Deficiency, Fractures)  Impaired fasting glucose - counseled re: diet, exercise. Recheck - Plan: Comprehensive metabolic panel, Hemoglobin A1c  Colon cancer screening - screening options discussed in detail, agrees to colonoscopy. Referred - Plan: Ambulatory referral to Gastroenterology  Adjustment disorder with depressed mood - vs grief.  Symptoms only x 3 weeks. Significant losses of family/friends in [place] from [place]. Counseling encouraged. f/u after back on thyroid meds  Family history of breast cancer in mother - and maternal aunt with ovarian CA.  Pt declines referral for genetic testing now; will consider at age 43  Elevated blood pressure reading in office without diagnosis of hypertension - Ddx reviewed--didn't sleep well yesterday, stress/depression. Reviewed low Na diet, regular exercise, sleep. Check elsewhere. Recheck at f/u 6-7 wks    Due to inability to lose weight, and worsening moods, is interested in restarting synthroid. (prefers that over starting any anti-depressants, given her prior response). Encouraged to seek out counseling from EAP or grief counseling.  Await TSH results before prescribing Synthroid. [facility] [place]  TSH, D, c-met, CBC, A1c (with labs) Lipids only if desired by pt.--pt wants  Discussed monthly self breast exams and yearly mammograms; at least 30 minutes of aerobic activity at least 5 days/week, weight-bearing exercise 2x/wk; proper sunscreen use reviewed; healthy diet, including goals of calcium and  vitamin D intake and alcohol recommendations (less than or equal to 1 drink/day) reviewed; regular seatbelt use; changing batteries in smoke detectors. Immunization recommendations discussed, UTD. Continue yearly flu shots. Shingrix at age 62. Colon cancer screening is recommended now, discussed cologuard and colonoscopy. Referred to GI for colonoscopy.  Will consider whether or not she is interested in genetic testing (mother was not tested for BRCA gene) She will let us know when she is interested, possibly at age 58.  F/u in approx 7 weeks for med check--to f/u on depression, thyroid, BP.  CPE 1 year  Addendum: TSH lower, don't think Synthroid should be started. Vit D low--sent in 12 wks rx, rec 4000-5000 IU after A1c 5.8, glu 101. Suggested 6 month f/u instead of 1 year to recheck TSH, D, sugars

## 2019-10-24 ENCOUNTER — Encounter: Payer: Self-pay | Admitting: Family Medicine

## 2019-10-24 ENCOUNTER — Other Ambulatory Visit: Payer: Self-pay

## 2019-10-24 ENCOUNTER — Ambulatory Visit (INDEPENDENT_AMBULATORY_CARE_PROVIDER_SITE_OTHER): Payer: BC Managed Care – PPO | Admitting: Family Medicine

## 2019-10-24 VITALS — BP 144/86 | HR 81 | Temp 98.6°F | Ht 60.5 in | Wt 147.0 lb

## 2019-10-24 DIAGNOSIS — Z803 Family history of malignant neoplasm of breast: Secondary | ICD-10-CM

## 2019-10-24 DIAGNOSIS — E039 Hypothyroidism, unspecified: Secondary | ICD-10-CM | POA: Diagnosis not present

## 2019-10-24 DIAGNOSIS — E559 Vitamin D deficiency, unspecified: Secondary | ICD-10-CM | POA: Diagnosis not present

## 2019-10-24 DIAGNOSIS — Z1211 Encounter for screening for malignant neoplasm of colon: Secondary | ICD-10-CM

## 2019-10-24 DIAGNOSIS — R7301 Impaired fasting glucose: Secondary | ICD-10-CM

## 2019-10-24 DIAGNOSIS — R030 Elevated blood-pressure reading, without diagnosis of hypertension: Secondary | ICD-10-CM

## 2019-10-24 DIAGNOSIS — F4321 Adjustment disorder with depressed mood: Secondary | ICD-10-CM

## 2019-10-24 DIAGNOSIS — Z0000 Encounter for general adult medical examination without abnormal findings: Secondary | ICD-10-CM

## 2019-10-24 LAB — POCT URINALYSIS DIP (PROADVANTAGE DEVICE)
Bilirubin, UA: NEGATIVE
Blood, UA: NEGATIVE
Glucose, UA: NEGATIVE mg/dL
Ketones, POC UA: NEGATIVE mg/dL
Leukocytes, UA: NEGATIVE
Nitrite, UA: NEGATIVE
Protein Ur, POC: NEGATIVE mg/dL
Specific Gravity, Urine: 1.005
Urobilinogen, Ur: 0.2
pH, UA: 7 (ref 5.0–8.0)

## 2019-10-25 ENCOUNTER — Encounter: Payer: Self-pay | Admitting: Gastroenterology

## 2019-10-25 LAB — COMPREHENSIVE METABOLIC PANEL
ALT: 23 IU/L (ref 0–32)
AST: 21 IU/L (ref 0–40)
Albumin/Globulin Ratio: 1.6 (ref 1.2–2.2)
Albumin: 4.5 g/dL (ref 3.8–4.8)
Alkaline Phosphatase: 82 IU/L (ref 48–121)
BUN/Creatinine Ratio: 14 (ref 9–23)
BUN: 8 mg/dL (ref 6–24)
Bilirubin Total: 0.5 mg/dL (ref 0.0–1.2)
CO2: 21 mmol/L (ref 20–29)
Calcium: 9.2 mg/dL (ref 8.7–10.2)
Chloride: 102 mmol/L (ref 96–106)
Creatinine, Ser: 0.58 mg/dL (ref 0.57–1.00)
GFR calc Af Amer: 126 mL/min/{1.73_m2} (ref >59)
GFR calc non Af Amer: 109 mL/min/{1.73_m2} (ref >59)
Globulin, Total: 2.8 g/dL (ref 1.5–4.5)
Glucose: 101 mg/dL — ABNORMAL HIGH (ref 65–99)
Potassium: 4.5 mmol/L (ref 3.5–5.2)
Sodium: 139 mmol/L (ref 134–144)
Total Protein: 7.3 g/dL (ref 6.0–8.5)

## 2019-10-25 LAB — CBC WITH DIFFERENTIAL/PLATELET
Basophils Absolute: 0 10*3/uL (ref 0.0–0.2)
Basos: 0 %
EOS (ABSOLUTE): 0.1 10*3/uL (ref 0.0–0.4)
Eos: 1 %
Hematocrit: 37.7 % (ref 34.0–46.6)
Hemoglobin: 12.9 g/dL (ref 11.1–15.9)
Immature Grans (Abs): 0 10*3/uL (ref 0.0–0.1)
Immature Granulocytes: 0 %
Lymphocytes Absolute: 2.2 10*3/uL (ref 0.7–3.1)
Lymphs: 27 %
MCH: 28 pg (ref 26.6–33.0)
MCHC: 34.2 g/dL (ref 31.5–35.7)
MCV: 82 fL (ref 79–97)
Monocytes Absolute: 0.5 10*3/uL (ref 0.1–0.9)
Monocytes: 7 %
Neutrophils Absolute: 5.2 10*3/uL (ref 1.4–7.0)
Neutrophils: 65 %
Platelets: 291 10*3/uL (ref 150–450)
RBC: 4.61 x10E6/uL (ref 3.77–5.28)
RDW: 12.2 % (ref 11.7–15.4)
WBC: 7.9 10*3/uL (ref 3.4–10.8)

## 2019-10-25 LAB — LIPID PANEL
Chol/HDL Ratio: 4.2 ratio (ref 0.0–4.4)
Cholesterol, Total: 188 mg/dL (ref 100–199)
HDL: 45 mg/dL (ref >39)
LDL Chol Calc (NIH): 124 mg/dL — ABNORMAL HIGH (ref 0–99)
Triglycerides: 106 mg/dL (ref 0–149)
VLDL Cholesterol Cal: 19 mg/dL (ref 5–40)

## 2019-10-25 LAB — HEMOGLOBIN A1C
Est. average glucose Bld gHb Est-mCnc: 120 mg/dL
Hgb A1c MFr Bld: 5.8 % — ABNORMAL HIGH (ref 4.8–5.6)

## 2019-10-25 LAB — VITAMIN D 25 HYDROXY (VIT D DEFICIENCY, FRACTURES): Vit D, 25-Hydroxy: 20.6 ng/mL — ABNORMAL LOW (ref 30.0–100.0)

## 2019-10-25 LAB — TSH: TSH: 4.05 u[IU]/mL (ref 0.450–4.500)

## 2019-10-25 MED ORDER — VITAMIN D (ERGOCALCIFEROL) 1.25 MG (50000 UNIT) PO CAPS: 50000.0000 [IU] | ORAL_CAPSULE | ORAL | 0 refills | Status: DC

## 2019-10-25 NOTE — Addendum Note (Signed)
Addended byRita Ohara on: 10/25/2019 07:31 AM   Modules accepted: Orders

## 2019-12-04 ENCOUNTER — Ambulatory Visit: Payer: BC Managed Care – PPO

## 2019-12-04 ENCOUNTER — Other Ambulatory Visit: Payer: Self-pay

## 2019-12-04 VITALS — Ht 60.0 in | Wt 149.6 lb

## 2019-12-04 DIAGNOSIS — Z1211 Encounter for screening for malignant neoplasm of colon: Secondary | ICD-10-CM

## 2019-12-04 MED ORDER — SUTAB 1479-225-188 MG PO TABS: 1.0000 | ORAL_TABLET | ORAL | 0 refills | Status: DC

## 2019-12-04 NOTE — Progress Notes (Signed)
Denies allergies to eggs or soy products. Denies complication of anesthesia or sedation. Denies use of weight loss medication. Denies use of O2.   Emmi instructions given for colonoscopy.   Covid vaccination completed 03/21.

## 2019-12-14 ENCOUNTER — Encounter: Payer: Self-pay | Admitting: Gastroenterology

## 2019-12-14 ENCOUNTER — Other Ambulatory Visit: Payer: Self-pay

## 2019-12-14 ENCOUNTER — Ambulatory Visit (AMBULATORY_SURGERY_CENTER): Payer: BC Managed Care – PPO | Admitting: Gastroenterology

## 2019-12-14 VITALS — BP 113/57 | HR 74 | Temp 98.4°F | Resp 21 | Ht 60.5 in | Wt 149.6 lb

## 2019-12-14 DIAGNOSIS — Z1211 Encounter for screening for malignant neoplasm of colon: Secondary | ICD-10-CM

## 2019-12-14 DIAGNOSIS — D122 Benign neoplasm of ascending colon: Secondary | ICD-10-CM

## 2019-12-14 DIAGNOSIS — K6289 Other specified diseases of anus and rectum: Secondary | ICD-10-CM

## 2019-12-14 MED ORDER — SODIUM CHLORIDE 0.9 % IV SOLN: 500.0000 mL | Freq: Once | INTRAVENOUS | Status: DC

## 2019-12-14 NOTE — Progress Notes (Signed)
PT taken to PACU. Monitors in place. VSS. Report given to RN. 

## 2019-12-14 NOTE — Progress Notes (Signed)
Called to room to assist during endoscopic procedure.  Patient ID and intended procedure confirmed with present staff. Received instructions for my participation in the procedure from the performing physician.  

## 2019-12-14 NOTE — Progress Notes (Signed)
VS- Megan Torres  Pt's states no medical or surgical changes since previsit or office visit.  

## 2019-12-14 NOTE — Patient Instructions (Signed)
Handout provided on polyps.   YOU HAD AN ENDOSCOPIC PROCEDURE TODAY AT THE  ENDOSCOPY CENTER:   Refer to the procedure report that was given to you for any specific questions about what was found during the examination.  If the procedure report does not answer your questions, please call your gastroenterologist to clarify.  If you requested that your care partner not be given the details of your procedure findings, then the procedure report has been included in a sealed envelope for you to review at your convenience later.  YOU SHOULD EXPECT: Some feelings of bloating in the abdomen. Passage of more gas than usual.  Walking can help get rid of the air that was put into your GI tract during the procedure and reduce the bloating. If you had a lower endoscopy (such as a colonoscopy or flexible sigmoidoscopy) you may notice spotting of blood in your stool or on the toilet paper. If you underwent a bowel prep for your procedure, you may not have a normal bowel movement for a few days.  Please Note:  You might notice some irritation and congestion in your nose or some drainage.  This is from the oxygen used during your procedure.  There is no need for concern and it should clear up in a day or so.  SYMPTOMS TO REPORT IMMEDIATELY:  Following lower endoscopy (colonoscopy or flexible sigmoidoscopy):  Excessive amounts of blood in the stool  Significant tenderness or worsening of abdominal pains  Swelling of the abdomen that is new, acute  Fever of 100F or higher  For urgent or emergent issues, a gastroenterologist can be reached at any hour by calling [phone]. Do not use MyChart messaging for urgent concerns.    DIET:  We do recommend a small meal at first, but then you may proceed to your regular diet.  Drink plenty of fluids but you should avoid alcoholic beverages for 24 hours.  ACTIVITY:  You should plan to take it easy for the rest of today and you should NOT DRIVE or use heavy  machinery until tomorrow (because of the sedation medicines used during the test).    FOLLOW UP: Our staff will call the number listed on your records 48-72 hours following your procedure to check on you and address any questions or concerns that you may have regarding the information given to you following your procedure. If we do not reach you, we will leave a message.  We will attempt to reach you two times.  During this call, we will ask if you have developed any symptoms of COVID 19. If you develop any symptoms (ie: fever, flu-like symptoms, shortness of breath, cough etc.) before then, please call [phone].  If you test positive for Covid 19 in the 2 weeks post procedure, please call and report this information to us.    If any biopsies were taken you will be contacted by phone or by letter within the next 1-3 weeks.  Please call us at [phone] if you have not heard about the biopsies in 3 weeks.    SIGNATURES/CONFIDENTIALITY: You and/or your care partner have signed paperwork which will be entered into your electronic medical record.  These signatures attest to the fact that that the information above on your After Visit Summary has been reviewed and is understood.  Full responsibility of the confidentiality of this discharge information lies with you and/or your care-partner.  

## 2019-12-14 NOTE — Progress Notes (Signed)
Pt had abdominal discomfort and cramping immediately after the procedure. Able to pass gas successfully. Pt given Levsin 0.25mg  and reported that she felt much better and the pain was relieved prior to being discharged. Passed a large amount of gas while in recovery.

## 2019-12-14 NOTE — Op Note (Signed)
[facility] Patient Name: Megan Torres Procedure Date: 12/14/2019 7:31 AM MRN: [id number] Endoscopist: Gerrit Heck , MD Age: 49 Referring MD:  Date of Birth: [birth year] Gender: Female Account #: [id number] Procedure:                Colonoscopy Indications:              Screening for colorectal malignant neoplasm, This                            is the patient's first colonoscopy Medicines:                Monitored Anesthesia Care Procedure:                Pre-Anesthesia Assessment:                           - Prior to the procedure, a History and Physical                            was performed, and patient medications and                            allergies were reviewed. The patient's tolerance of                            previous anesthesia was also reviewed. The risks                            and benefits of the procedure and the sedation                            options and risks were discussed with the patient.                            All questions were answered, and informed consent                            was obtained. Prior Anticoagulants: The patient has                            taken no previous anticoagulant or antiplatelet                            agents. ASA Grade Assessment: II - A patient with                            mild systemic disease. After reviewing the risks                            and benefits, the patient was deemed in                            satisfactory condition to undergo the procedure.  After obtaining informed consent, the colonoscope                            was passed under direct vision. Throughout the                            procedure, the patient's blood pressure, pulse, and                            oxygen saturations were monitored continuously. The                            Colonoscope was introduced through the anus and                            advanced to the the  terminal ileum. The colonoscopy                            was performed without difficulty. The patient                            tolerated the procedure well. The quality of the                            bowel preparation was excellent. The terminal                            ileum, ileocecal valve, appendiceal orifice, and                            rectum were photographed. Scope In: 8:03:34 AM Scope Out: 8:16:56 AM Scope Withdrawal Time: 0 hours 10 minutes 49 seconds  Total Procedure Duration: 0 hours 13 minutes 22 seconds  Findings:                 The perianal and digital rectal examinations were                            normal.                           A 2 mm polyp was found in the ascending colon. The                            polyp was sessile. The polyp was removed with a                            cold biopsy forceps. Resection and retrieval were                            complete. Estimated blood loss was minimal.                           Anal papilla(e) were hypertrophied.  The exam was otherwise normal throughout the                            remainder of the colon.                           The terminal ileum appeared normal. Complications:            No immediate complications. Estimated Blood Loss:     Estimated blood loss was minimal. Impression:               - One 2 mm polyp in the ascending colon, removed                            with a cold biopsy forceps. Resected and retrieved.                           - Anal papilla(e) were hypertrophied.                           - The examined portion of the ileum was normal. Recommendation:           - Patient has a contact number available for                            emergencies. The signs and symptoms of potential                            delayed complications were discussed with the                            patient. Return to normal activities tomorrow.                             Written discharge instructions were provided to the                            patient.                           - Resume previous diet.                           - Continue present medications.                           - Await pathology results.                           - Repeat colonoscopy in 5-10 years for surveillance                            based on pathology results.                           - Return to GI office PRN. Gerrit Heck, MD 12/14/2019 8:22:04 AM

## 2019-12-17 NOTE — Progress Notes (Signed)
Chief Complaint  Patient presents with  . Consult    concerning possible depression. Went for weekend retreat and feels better.   . Rash    rash vs.razor burn. Was walking in park Wed 9/1 and did get bug bite was itching and thinks it may have spread.    Patient presents for follow-up on depressive symptoms.  This was noted at her physical in July, when she reported feeling more depressed since many losses (family, friends, close family friends) due to [place].  Parents and sister all had COVID, but recovered.  Lost 26 friends/family in [place] due to [place].  She had reported that she was very sad for a few weeks, never really cried or addressed it, hadn't dealt with it, was busy at work. She was encouraged to look into grief counseling, contact EAP. PHQ-9 score of 9, but only having symptoms x 3 weeks prior to visit.  She had mentioned that she didn't like how she felt on antidepressants after divorce years ago (only took x 1 week, felt like a zombie). Hesitant to take medications. We checked her thyroid, which lower than on prior check (at upper limit of normal), so meds weren't restarted.  Her Vitamin D level was low, so this was replaced.  She is still on the prescription D weekly. Lab Results  Component Value Date   TSH 4.050 10/24/2019  Vitamin D-OH 20.6.  She went to a meditation/yoga retreat at [facility] in [place] the end of July. She reports that she cried a lot during the retreat, felt like she processed, accepted things, and she feels much better. She continues to do yoga and meditate.  She got names/contacts through wellness program at her office, but she hasn't contacted them yet. Doesn't really feel like she needs counseling right now.  She has had some other stressors, has handled them well.  Father had a benign Schwannoma removed recently, had L sided numbness related to position during surgery (pinched nerve).  He got home 2 weeks ago, with HH/therapy. He is in [place], her  sisters are there. He is doing better.  Her blood pressure was elevated at last visit--she hadn't slept well the night before. BP was normal at recent colonoscopy.  (She had polyp, path still pending).  BP Readings from Last 3 Encounters:  12/14/19 (!) 113/57  10/24/19 (!) 144/86  10/12/18 110/60   She is also concerned about rash on her legs.  She felt bugs biting her on her legs while walking on Wednesday.  Shaved the next day, she had some spread, wasn't sure if related to the razor or something else.  The bumps spread across both lower extremities, notices on at her L toe, and R arm (near antecubital fossa). They are itchy. Coconut oil helps, and using caladryl.  PMH, PSH, SH reviewed  Outpatient Encounter Medications as of 12/19/2019  Medication Sig  . Vitamin D, Ergocalciferol, (DRISDOL) 1.25 MG (50000 UNIT) CAPS capsule Take 1 capsule (50,000 Units total) by mouth every 7 (seven) days.  Marland Kitchen VITAMIN D PO Take 2,000 Units by mouth. (Patient not taking: Reported on 12/14/2019)   No facility-administered encounter medications on file as of 12/19/2019.   No Known Allergies  ROS: no fever, chills, URI symptoms, headaches, dizziness, chest pain, shortness of breath. Denies urinary complaints. Moods are improved. Rash per HPI.  She reports that last week she started following a more strict vegetarian diet, cut back on carbs, and has lost some weight.  PHYSICAL EXAM:  BP 120/86   Pulse 68   Ht 5\' 1"  (1.549 m)   Wt 146 lb 6.4 oz (66.4 kg)   LMP 11/28/2019   BMI 27.66 kg/m   Wt Readings from Last 3 Encounters:  12/19/19 146 lb 6.4 oz (66.4 kg)  12/14/19 149 lb 9.6 oz (67.9 kg)  12/04/19 149 lb 9.6 oz (67.9 kg)   Pleasant, well-appearing female, in no distress HEENT: conjunctiva and sclera are clear, EOMI, wearing mask. Normal mood, affect, hygiene and grooming, normal eye contact and speech. Extremities: no edema Skin: some diffuse red papules on both lower legs (started at  posterior L calf, since spread).  No surrounding erythema, warmth, swelling.  Not vesicular.   PHQ9 score of 1   ASSESSMENT/PLAN:  Vitamin D deficiency - to complete the rx course, then start taking 4000-5000 IU upon completion, continue longterm.  Need for influenza vaccination - Plan: Flu Vaccine QUAD 6+ mos PF IM (Fluarix Quad PF)  Adjustment disorder with depressed mood - This is much better. cont meditation. If moods worsen, to contact therapists through work EAP. Cont yoga, exercise  Rash - on LE's--insect bites vs contact derm. Treat with antihistamine and 1% hydrocortisone cream 2-3x/day prn.   Vitamin D deficiency--to take 4000-5000 IU daily upon completion of the prescription vitamin D.  6 month f/u instead of 1 year to recheck TSH, D, sugars   Use 1% hydrocortisone cream 2-3 times daily if needed for the itchy rash on the legs. I think taking an antihistamine such as claritin or allegra or zyrtec once daily until resolved is also a good idea. Avoid shaving--you will likely shave off the tops and make things worse.  25-30 min FTF, plus additional time in chart review and documentation.

## 2019-12-19 ENCOUNTER — Other Ambulatory Visit: Payer: Self-pay

## 2019-12-19 ENCOUNTER — Telehealth: Payer: Self-pay | Admitting: *Deleted

## 2019-12-19 ENCOUNTER — Encounter: Payer: Self-pay | Admitting: Family Medicine

## 2019-12-19 ENCOUNTER — Ambulatory Visit (INDEPENDENT_AMBULATORY_CARE_PROVIDER_SITE_OTHER): Payer: BC Managed Care – PPO | Admitting: Family Medicine

## 2019-12-19 VITALS — BP 120/86 | HR 68 | Ht 61.0 in | Wt 146.4 lb

## 2019-12-19 DIAGNOSIS — F4321 Adjustment disorder with depressed mood: Secondary | ICD-10-CM | POA: Diagnosis not present

## 2019-12-19 DIAGNOSIS — Z23 Encounter for immunization: Secondary | ICD-10-CM | POA: Diagnosis not present

## 2019-12-19 DIAGNOSIS — E559 Vitamin D deficiency, unspecified: Secondary | ICD-10-CM

## 2019-12-19 DIAGNOSIS — R21 Rash and other nonspecific skin eruption: Secondary | ICD-10-CM

## 2019-12-19 NOTE — Patient Instructions (Addendum)
   Use 1% hydrocortisone cream 2-3 times daily if needed for the itchy rash on the legs. I think taking an antihistamine such as claritin or allegra or zyrtec once daily until resolved is also a good idea. Avoid shaving--you will likely shave off the tops and make things worse.  Be sure to start 4000-5000 IU of vitamin D3 daily after you finish the prescription capsules.

## 2019-12-19 NOTE — Telephone Encounter (Signed)
  Follow up Call-  Call back number 12/14/2019  Post procedure Call Back phone  # [phone]  Permission to leave phone message Yes  Some recent data might be hidden     Patient questions:  Do you have a fever, pain , or abdominal swelling? No. Pain Score  0 *  Have you tolerated food without any problems? Yes.    Have you been able to return to your normal activities? Yes.    Do you have any questions about your discharge instructions: Diet   No. Medications  No. Follow up visit  No.  Do you have questions or concerns about your Care? No.  Actions: * If pain score is 4 or above: No action needed, pain <4.  1. Have you developed a fever since your procedure? no  2.   Have you had an respiratory symptoms (SOB or cough) since your procedure? no  3.   Have you tested positive for COVID 19 since your procedure no  4.   Have you had any family members/close contacts diagnosed with the COVID 19 since your procedure?  no   If yes to any of these questions please route to Joylene John, RN and Joella Prince, RN

## 2019-12-21 ENCOUNTER — Encounter: Payer: BC Managed Care – PPO | Admitting: Gastroenterology

## 2019-12-24 ENCOUNTER — Encounter: Payer: Self-pay | Admitting: Gastroenterology

## 2020-02-18 ENCOUNTER — Other Ambulatory Visit: Payer: Self-pay | Admitting: Family Medicine

## 2020-02-18 ENCOUNTER — Encounter: Payer: Self-pay | Admitting: Family Medicine

## 2020-02-18 DIAGNOSIS — E559 Vitamin D deficiency, unspecified: Secondary | ICD-10-CM

## 2020-04-19 ENCOUNTER — Encounter: Payer: Self-pay | Admitting: Family Medicine

## 2020-04-25 ENCOUNTER — Other Ambulatory Visit: Payer: Self-pay

## 2020-04-25 ENCOUNTER — Encounter: Payer: Self-pay | Admitting: Medical

## 2020-04-25 ENCOUNTER — Telehealth: Payer: Managed Care, Other (non HMO) | Admitting: Medical

## 2020-04-25 VITALS — Ht 61.0 in | Wt 140.0 lb

## 2020-04-25 DIAGNOSIS — J988 Other specified respiratory disorders: Secondary | ICD-10-CM | POA: Diagnosis not present

## 2020-04-25 DIAGNOSIS — R059 Cough, unspecified: Secondary | ICD-10-CM | POA: Diagnosis not present

## 2020-04-25 MED ORDER — HYDROCOD POLST-CPM POLST ER 10-8 MG/5ML PO SUER: 5.0000 mL | Freq: Two times a day (BID) | ORAL | 0 refills | Status: DC

## 2020-04-25 NOTE — Progress Notes (Signed)
Subjective:     Patient ID: Megan Torres, female   DOB: 1970/10/28, 50 y.o.   MRN: [id number]  This visit type was conducted due to national recommendations for restrictions regarding the COVID-19 Pandemic (e.g. social distancing) in an effort to limit this patient's exposure and mitigate transmission in our community.  Due to their co-morbid illnesses, this patient is at least at moderate risk for complications without adequate follow up.  This format is felt to be most appropriate for this patient at this time.    Documentation for virtual audio and video telecommunications through [facility] encounter:  The patient was located at home. The provider was located in the office. The patient did consent to this visit and is aware of possible charges through their insurance for this visit.  The other persons participating in this telemedicine service were none. Time spent on call was 20 minutes and in review of previous records 20 minutes total.  This virtual service is not related to other E/M service within previous 7 days.   HPI Chief Complaint  Patient presents with  . Cough    Cough 04/13/20.    Virtual consult for cough and respiratory infection.  Symptoms began 04/13/20.  Started with cold symptoms, cough, headache, facial congestion, sore throat, and over the last week has had worsening cough and sore throat.  Saw urgent care twice.  Was initially prescribed one antibiotic and some nasal spray.  Went back a second time to UC, was given different antibiotic, different nasal spray and throat spray.  Given prednisone.  She is having coughing fits that cough and sore throat are the main symptoms right now.  No loss of smell or taste, no body aches.  She does have some fatigue.  She has a few more day left of antibiotic and prednisone and plans to finish those out.  She is using nasal spray and throat spray given by the urgent care.  Her main concern is she would like something for  cough that is more beneficial than what she is taking.  Her current regimen is not helping her cough much.  She also has Flovent that was given by urgent care which she is using.  She rinses her mouth out with water after use.  On her initial visit to urgent care she had negative COVID antigen, negative strep, negative flu and the PCR was sent.  However a few days later she learned that they lost the sample for PCR.  She has since went back and had another PCR but is still pending that test result  Her oxygen has been 98% or above.  One of her concerns is blood-tinged mucus coming out of her sinuses.    She and husband have had both the vaccine and the booster for COVID  Past Medical History:  Diagnosis Date  . Allergy   . Family history of breast cancer in mother   . Family history of breast cancer in mother    she was never tested for BRCA  . Family history of ovarian cancer   . Thyroid disease    HYPOTHYROID, h/o hyperthyroidism.  off meds since 8/09  (Dr. Dwyane Dee)  . Vitamin D deficiency 2010    Review of Systems As in subjective    Objective:   Physical Exam Due to coronavirus pandemic stay at home measures, patient visit was virtual and they were not examined in person.   Ht 5' 1" (1.549 m)   Wt 140 lb (63.5 kg)  SpO2 95%   BMI 26.45 kg/m   Gen: wd, wn, nad, mildly ill-appearing but coughing quite a bit No obvious SOB or wheezing Answers questions in complete sentences      Assessment:     Encounter Diagnoses  Name Primary?  . Cough Yes  . Respiratory tract infection        Plan:     We discussed her symptoms and concerns.  I reviewed urgent care information, including normal chest x-ray on January 9, and reviewed treatment regimen they prescribed.  We discussed that this very well still could be COVID and that symptoms can linger for a while including fatigue and cough  I prescribed medication below to help with cough.  We discussed risk and benefits and  proper use of medication.  I advise she finish out the steroid and antibiotic Augmentin which should be done within 2 days.  She can continue the nasal spray and sore throat spray.  We discussed importance of hydration, rest, and she can continue the vitamin pack she is doing.  She is also doing guaifenesin  We discussed the potential benefit of albuterol inhaler but she declines that for now.  She should be getting PCR test back any day now.  I advised if not much improved within the next 48 hours to consider chest x-ray.  I advised her to send message through MyChart if not improving or getting worse.  If much worse over the weekend go to the hospital  She voiced understanding and agreement of plan  Gisele was seen today for cough.  Diagnoses and all orders for this visit:  Cough  Respiratory tract infection  Other orders -     chlorpheniramine-HYDROcodone (TUSSIONEX PENNKINETIC ER) 10-8 MG/5ML SUER; Take 5 mLs by mouth 2 (two) times daily.  f/u if not improving or if worsening

## 2020-05-06 ENCOUNTER — Encounter: Payer: Self-pay | Admitting: Family Medicine

## 2020-05-09 ENCOUNTER — Telehealth: Payer: Self-pay

## 2020-05-09 NOTE — Telephone Encounter (Signed)
Spoke to pt and scheduled her for wed of next week. PCR was negative. Megan Torres

## 2020-05-12 ENCOUNTER — Ambulatory Visit: Payer: Self-pay | Admitting: Family Medicine

## 2020-05-13 NOTE — Progress Notes (Signed)
Chief Complaint  Patient presents with  . other    Last week had periods was very tired no energy, today lt. Eye swollen, hurts to touch eye, being of Jan. Thinks she possibly had covid tested twice being of Jan. Negative both times around 04/15/20 but thinks she had it then because she was really sick had all the symptoms.    Patient presents to discuss fatigue.  Yesterday she noted discomfort in the left eye, along the upper lid. She woke up with the lid swollen.  She has been icing it today (and it has been burning some). Slight crusting in the corner of the eye. No changes to vision, no redness of the eye.  She sent message 1/25 stating: "I am experiencing lot of fatigue since last week.  I am only taking Vit D 2000 units a day. No other oTC meds. Having hard time staying alert or active. Last Friday and Saturday had way too tired and was mostly in bed. Had my periods on the 19th day 1. Post this experiencing fatigue almost every day. Is there anything i can take to feel a bit more energy ?  Walked an hour twice in the last 6 days which has made me more exhausted."  LMP 1/19.  Later that week is when she felt exhausted.  Cycle wasn't particularly heavy, more painful than normal (not that bad since she was in her teens), especially at the beginning of the cycle.  She had been sick since 04/13/20 (cold symptoms, cough, headache, facial congestion, sore throat).  She had gone to UC twice, put on ABX, atrovent nasal spray, Flovent and prednisone. She had negative tests for flu, strep, COVID Ag, and on 2nd visit, had negative COVID PCR. She had a normal CXR. She saw Audelia Acton on 1/14 due to ongoing cough, was told to complete the treatments from UC (ABX, prednisone), and given Tussionex for cough.   She reports cough and URI symptoms resolved. She has persistent fatigue, and some brain fog.  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  She thinks she had COVID (even though  tests from UC were negative). Denies loss of smell or taste.  Patient has history of hypothyroidism and vitamin D deficiency. Given her symptoms of fatigue, appointment was made today to discuss, and to recheck labs.  She used to see Dr. Dwyane Dee. She stopped Synthroid in 2011, went back on during evaluation for infertility x 6 mos, stopped 12/2015. TSH was 4.35 in 09/2016. She restarted Synthroid 12mcg daily shortly after, related to hair loss, and recheck TSH in August 2018 was 2.53.  She stopped taking Synthroid again 2-3 years ago. TSH has remained mildly elevated, but asymptomatic and declined treatment (subclinical hypothyroidism).  She has h/o +TPO antibodies.Denies changes to hair/skin/nails/bowels/moods. She has fatigue.  Weight at home was 142# yesterday, denies weight gain. Lab Results  Component Value Date   TSH 4.050 10/24/2019   Vitamin D deficiency:  Her last level was low in 10/2019, at 20.6.  She was taking 2000 IU daily at that time. She was treated with 12 weeks of 50,000 IU weekly, and was told to take 4000-5000 IU daily upon completion.  She took 4000 IU in December, but in January she decreased back to 2000 IU (taking too many meds, when she was sick).  Hasn't taken any in the last week (lost bottle).  Sleep and moods have been good (just some irritability related to being tired).  No snoring.  PMH, PSH, SH reviewed  Outpatient Encounter Medications as of 05/14/2020  Medication Sig Note  . VITAMIN D PO Take 2,000 Units by mouth. (Patient not taking: Reported on 05/14/2020) 05/14/2020: Ran out last week. Took 2000 IU the month of January, 4000 IU in December (rx prior to that)  . [DISCONTINUED] BENZOCAINE, DENTAL, 20 % LIQD Use as directed 1 application in the mouth or throat 4 (four) times daily as needed for Pain. (Patient not taking: Reported on 05/14/2020)   . [DISCONTINUED] chlorpheniramine-HYDROcodone (TUSSIONEX PENNKINETIC ER) 10-8 MG/5ML SUER Take 5 mLs by mouth 2 (two) times  daily. (Patient not taking: Reported on 05/14/2020)   . [DISCONTINUED] fluticasone (FLOVENT HFA) 44 MCG/ACT inhaler Inhale into the lungs.   . [DISCONTINUED] ipratropium (ATROVENT) 0.03 % nasal spray Place into the nose.   . [DISCONTINUED] Vitamin D, Ergocalciferol, (DRISDOL) 1.25 MG (50000 UNIT) CAPS capsule Take 1 capsule (50,000 Units total) by mouth every 7 (seven) days. (Patient not taking: Reported on 05/14/2020)    No facility-administered encounter medications on file as of 05/14/2020.   Tried taking Centrum x 2d, had some nausea so stopped.  No Known Allergies  ROS:  No fever, chills, headaches, dizziness.  URI symptoms resolved, only very rare cough.  No chest pain or shortness of breath. No nausea, vomiting or diarrhea. Periods are normal, but had more cramping early on with last cycle. Denies any hot flashes or night sweats (had some in November). No numbness, tingling, weakness, syncope, rashes, bleeding, bruising. No hair loss, changes to bowels, moods, skin or nails.   PHYSICAL EXAM:  BP 130/84   Pulse 88   Temp 98.4 F (36.9 C)   Wt 148 lb 3.2 oz (67.2 kg)   LMP 04/30/2020   BMI 28.00 kg/m   Wt Readings from Last 3 Encounters:  05/14/20 148 lb 3.2 oz (67.2 kg)  04/25/20 140 lb (63.5 kg)  12/19/19 146 lb 6.4 oz (66.4 kg)   (1/14 was home scale)  Pleasant, tired-appearing female, in no distress HEENT: conjunctiva and sclera are clear, EOMI.  Mild diffuse swelling of the left upper lid, at more distal margin--no erythema, flaking or focal swelling.  Neck: no lymphadenopathy, thyromegaly or mass Heart: regular rate and rhythm Lungs: clear bilaterally Back: no spinal or CVA tenderness Abdomen: soft, nontender, no mass Extremities: no edema Neuro: alert and oriented, normal gait.  DTR's 1+ and symmetric Psych: normal mood, affect, hygiene and grooming   ASSESSMENT/PLAN:  Fatigue, unspecified type - Plan: TSH, VITAMIN D 25 Hydroxy (Vit-D Deficiency, Fractures),  CBC with Differential/Platelet, SAR CoV2 Serology (COVID 19)AB(IGG)IA  History of upper respiratory infection - symptoms resolved; she feels like her sx (fatigue, brain fog) could have been COVID, tested negative. Check Ab's - Plan: SAR CoV2 Serology (COVID 19)AB(IGG)IA  Need for hepatitis C screening test - Plan: Hepatitis C antibody  Vitamin D deficiency - rec restart 4000 IU (unless level is low and requires rx), recheck level - Plan: VITAMIN D 25 Hydroxy (Vit-D Deficiency, Fractures)  Hypothyroidism, unspecified type - has been subclinical, but +TPO Ab. No sx other than fatigue. Discussed starting tx only if >7-8 - Plan: TSH  Pain and swelling of eyelid of left eye - ddx reviewed--likely blepharitis, vs early stye.  Proper treatments for both dx reviewed.  Vit D, TSH, COVID Ab, Hep C Ab, CBC,  Stay well hydrated. Your labs will be available tomorrow--if your vitamin D is only slightly low, restart Vitamin D3 at 07-4998 IU daily (and continue  that dose longterm).  If it is lower, we will send in another prescription, and you can hold off on the over-the-counter D until you finish the prescription.   Your eye complaint is either a very early start to the formation of a stye, or blepharitis. See the handouts for information on treatment of these. For now--warm compresses and possibly try baby shampoo.

## 2020-05-14 ENCOUNTER — Encounter: Payer: Self-pay | Admitting: Family Medicine

## 2020-05-14 ENCOUNTER — Other Ambulatory Visit: Payer: Self-pay

## 2020-05-14 ENCOUNTER — Ambulatory Visit: Payer: Managed Care, Other (non HMO) | Admitting: Family Medicine

## 2020-05-14 VITALS — BP 130/84 | HR 88 | Temp 98.4°F | Wt 148.2 lb

## 2020-05-14 DIAGNOSIS — Z1159 Encounter for screening for other viral diseases: Secondary | ICD-10-CM | POA: Diagnosis not present

## 2020-05-14 DIAGNOSIS — E559 Vitamin D deficiency, unspecified: Secondary | ICD-10-CM | POA: Diagnosis not present

## 2020-05-14 DIAGNOSIS — H02846 Edema of left eye, unspecified eyelid: Secondary | ICD-10-CM

## 2020-05-14 DIAGNOSIS — E039 Hypothyroidism, unspecified: Secondary | ICD-10-CM

## 2020-05-14 DIAGNOSIS — Z8709 Personal history of other diseases of the respiratory system: Secondary | ICD-10-CM | POA: Diagnosis not present

## 2020-05-14 DIAGNOSIS — R5383 Other fatigue: Secondary | ICD-10-CM | POA: Diagnosis not present

## 2020-05-14 DIAGNOSIS — H0289 Other specified disorders of eyelid: Secondary | ICD-10-CM

## 2020-05-14 NOTE — Patient Instructions (Signed)
Stay well hydrated. Your labs will be available tomorrow--if your vitamin D is only slightly low, restart Vitamin D3 at 07-4998 IU daily (and continue that dose longterm).  If it is lower, we will send in another prescription, and you can hold off on the over-the-counter D until you finish the prescription.   Your eye complaint is either a very early start to the formation of a stye, or blepharitis. See the handouts for information on treatment of these. For now--warm compresses and possibly try baby shampoo.   Stye A stye, also known as a hordeolum, is a bump that forms on an eyelid. It may look like a pimple next to the eyelash. A stye can form inside the eyelid (internal stye) or outside the eyelid (external stye). A stye can cause redness, swelling, and pain on the eyelid. Styes are very common. Anyone can get them at any age. They usually occur in just one eye, but you may have more than one in either eye. What are the causes? A stye is caused by an infection. The infection is almost always caused by bacteria called Staphylococcus aureus. This is a common type of bacteria that lives on the skin. An internal stye may result from an infected oil-producing gland inside the eyelid. An external stye may be caused by an infection at the base of the eyelash (hair follicle). What increases the risk? You are more likely to develop a stye if:  You have had a stye before.  You have any of these conditions: ? Diabetes. ? Red, itchy, inflamed eyelids (blepharitis). ? A skin condition such as seborrheic dermatitis or rosacea. ? High fat levels in your blood (lipids). What are the signs or symptoms? The most common symptom of a stye is eyelid pain. Internal styes are more painful than external styes. Other symptoms may include:  Painful swelling of your eyelid.  A scratchy feeling in your eye.  Tearing and redness of your eye.  Pus draining from the stye.   How is this diagnosed? Your health  care provider may be able to diagnose a stye just by examining your eye. The health care provider may also check to make sure:  You do not have a fever or other signs of a more serious infection.  The infection has not spread to other parts of your eye or areas around your eye. How is this treated? Most styes will clear up in a few days without treatment or with warm compresses applied to the area. You may need to use antibiotic drops or ointment to treat an infection. In some cases, if your stye does not heal with routine treatment, your health care provider may drain pus from the stye using a thin blade or needle. This may be done if the stye is large, causing a lot of pain, or affecting your vision. Follow these instructions at home:  Take over-the-counter and prescription medicines only as told by your health care provider. This includes eye drops or ointments.  If you were prescribed an antibiotic medicine, apply or use it as told by your health care provider. Do not stop using the antibiotic even if your condition improves.  Apply a warm, wet cloth (warm compress) to your eye for 5-10 minutes, 4 times a day.  Clean the affected eyelid as directed by your health care provider.  Do not wear contact lenses or eye makeup until your stye has healed.  Do not try to pop or drain the stye.  Do  not rub your eye. Contact a health care provider if:  You have chills or a fever.  Your stye does not go away after several days.  Your stye affects your vision.  Your eyeball becomes swollen, red, or painful. Get help right away if:  You have pain when moving your eye around. Summary  A stye is a bump that forms on an eyelid. It may look like a pimple next to the eyelash.  A stye can form inside the eyelid (internal stye) or outside the eyelid (external stye). A stye can cause redness, swelling, and pain on the eyelid.  Your health care provider may be able to diagnose a stye just by  examining your eye.  Apply a warm, wet cloth (warm compress) to your eye for 5-10 minutes, 4 times a day. This information is not intended to replace advice given to you by your health care provider. Make sure you discuss any questions you have with your health care provider. Document Revised: 12/06/2019 Document Reviewed: 12/06/2019 Elsevier Patient Education  2021 [employer].   Blepharitis Blepharitis is swelling of the eyelids. Symptoms may include:  Reddish, scaly skin around the scalp and eyebrows.  Burning or itching of the eyelids.  Fluid coming from the eye at night. This causes the eyelashes to stick together in the morning.  Eyelashes that fall out.  Being sensitive to light. Follow these instructions at home: Pay attention to any changes in how you look or feel. Tell your health care provider about any changes. Follow these instructions to help with your condition: Keeping clean  Wash your hands often.  Wash your eyelids with warm water, or wash them with warm water that is mixed with little bit of baby shampoo. Do this 2 or more times per day.  Wash your face and eyebrows at least once a day.  Use a clean towel each time you dry your eyelids. Do not use the towel to clean or dry other areas of your body. Do not share your towel with anyone.   General instructions  Avoid wearing makeup until you get better. Do not share makeup with anyone.  Avoid rubbing your eyes.  Put a warm compress on your eyes 2 times per day for 10 minutes at a time, or as told by your doctor.  If you were given antibiotics in the form of creams or eye drops, use the medicine as told by your doctor. Do not stop using the medicine even if you feel better.  Keep all follow-up visits as told by your doctor. This is important. Contact a doctor if:  Your eyelids feel hot.  You have blisters on your eyelids.  You have a rash on your eyelids.  The swelling does not go away in 2-4  days.  The swelling gets worse. Get help right away if:  You have pain that gets worse.  You have pain that spreads to other parts of your face.  You have redness that gets worse.  You have redness that spreads to other parts of your face.  Your vision changes.  You have pain when you look at lights or things that move.  You have a fever. Summary  Blepharitis is swelling of the eyelids.  Pay attention to any changes in how your eyes look or feel. Tell your doctor about any changes.  Follow home care instructions as told by your doctor. Wash your hands often. Avoid wearing makeup. Do not rub your eyes.  Use warm  compresses, creams, or eye drops as told by your doctor.  Let your doctor know if you have changes in vision, blisters or rash on eyelids, pain that spreads to your face, or warmth on your eyelids. This information is not intended to replace advice given to you by your health care provider. Make sure you discuss any questions you have with your health care provider. Document Revised: 09/26/2017 Document Reviewed: 09/26/2017 Elsevier Patient Education  [employer].

## 2020-05-15 LAB — CBC WITH DIFFERENTIAL/PLATELET
Basophils Absolute: 0 10*3/uL (ref 0.0–0.2)
Basos: 0 %
EOS (ABSOLUTE): 0.1 10*3/uL (ref 0.0–0.4)
Eos: 2 %
Hematocrit: 38.5 % (ref 34.0–46.6)
Hemoglobin: 13.1 g/dL (ref 11.1–15.9)
Immature Grans (Abs): 0 10*3/uL (ref 0.0–0.1)
Immature Granulocytes: 0 %
Lymphocytes Absolute: 2.9 10*3/uL (ref 0.7–3.1)
Lymphs: 33 %
MCH: 28.4 pg (ref 26.6–33.0)
MCHC: 34 g/dL (ref 31.5–35.7)
MCV: 83 fL (ref 79–97)
Monocytes Absolute: 0.6 10*3/uL (ref 0.1–0.9)
Monocytes: 6 %
Neutrophils Absolute: 5.2 10*3/uL (ref 1.4–7.0)
Neutrophils: 59 %
Platelets: 248 10*3/uL (ref 150–450)
RBC: 4.62 x10E6/uL (ref 3.77–5.28)
RDW: 12.6 % (ref 11.7–15.4)
WBC: 8.8 10*3/uL (ref 3.4–10.8)

## 2020-05-15 LAB — VITAMIN D 25 HYDROXY (VIT D DEFICIENCY, FRACTURES): Vit D, 25-Hydroxy: 20.7 ng/mL — ABNORMAL LOW (ref 30.0–100.0)

## 2020-05-15 LAB — TSH: TSH: 2.87 u[IU]/mL (ref 0.450–4.500)

## 2020-05-15 LAB — HEPATITIS C ANTIBODY: Hep C Virus Ab: <0.1 s/co ratio (ref 0.0–0.9)

## 2020-05-15 LAB — SAR COV2 SEROLOGY (COVID19)AB(IGG),IA
SARS-CoV-2 Semi-Quant IgG Ab: >800 AU/mL (ref <13.0)
SARS-CoV-2 Spike Ab Interp: POSITIVE

## 2020-05-15 MED ORDER — VITAMIN D (ERGOCALCIFEROL) 1.25 MG (50000 UNIT) PO CAPS: 50000.0000 [IU] | ORAL_CAPSULE | ORAL | 0 refills | Status: DC

## 2020-05-15 NOTE — Addendum Note (Signed)
Addended by: Rita Ohara on: 05/15/2020 07:31 AM   Modules accepted: Orders

## 2020-05-16 ENCOUNTER — Encounter: Payer: Self-pay | Admitting: Family Medicine

## 2020-06-26 ENCOUNTER — Telehealth: Payer: Self-pay | Admitting: *Deleted

## 2020-06-26 DIAGNOSIS — Z0000 Encounter for general adult medical examination without abnormal findings: Secondary | ICD-10-CM

## 2020-06-26 DIAGNOSIS — E039 Hypothyroidism, unspecified: Secondary | ICD-10-CM

## 2020-06-26 DIAGNOSIS — E559 Vitamin D deficiency, unspecified: Secondary | ICD-10-CM

## 2020-06-26 DIAGNOSIS — R7301 Impaired fasting glucose: Secondary | ICD-10-CM

## 2020-06-26 NOTE — Telephone Encounter (Signed)
Patient called and asked if she can change her med check on Monday to a virtual as she can not make it into the office.

## 2020-06-26 NOTE — Telephone Encounter (Signed)
Patient has no issues, appt for Monday canceled. She did schedule appointment for fasting labs prior ot her July CPE as it is scheduled for 1:45pm. She scheduled labs for 10/23/20 @ 8:45-need orders please.

## 2020-06-26 NOTE — Telephone Encounter (Signed)
Reason for 6 month was f/u D, TSH and sugar.  We checked D and TSH at Feb visit. CPE is scheduled for July.  If moods are fine, and has no issues, okay to just wait until July visit. (doesn't need at all)

## 2020-06-30 ENCOUNTER — Encounter: Payer: BC Managed Care – PPO | Admitting: Family Medicine

## 2020-09-11 ENCOUNTER — Encounter: Payer: Self-pay | Admitting: Family Medicine

## 2020-09-12 ENCOUNTER — Other Ambulatory Visit: Payer: Self-pay

## 2020-09-12 ENCOUNTER — Encounter: Payer: Self-pay | Admitting: Medical

## 2020-09-12 ENCOUNTER — Telehealth: Payer: Managed Care, Other (non HMO) | Admitting: Medical

## 2020-09-12 VITALS — Temp 102.0°F | Wt 143.0 lb

## 2020-09-12 DIAGNOSIS — R5383 Other fatigue: Secondary | ICD-10-CM

## 2020-09-12 DIAGNOSIS — U071 COVID-19: Secondary | ICD-10-CM | POA: Diagnosis not present

## 2020-09-12 DIAGNOSIS — R52 Pain, unspecified: Secondary | ICD-10-CM

## 2020-09-12 DIAGNOSIS — G43701 Chronic migraine without aura, not intractable, with status migrainosus: Secondary | ICD-10-CM

## 2020-09-12 DIAGNOSIS — R059 Cough, unspecified: Secondary | ICD-10-CM | POA: Diagnosis not present

## 2020-09-12 MED ORDER — ONDANSETRON 4 MG PO TBDP: 4.0000 mg | ORAL_TABLET | Freq: Four times a day (QID) | ORAL | 1 refills | Status: DC | PRN

## 2020-09-12 MED ORDER — HYDROCOD POLST-CPM POLST ER 10-8 MG/5ML PO SUER: 5.0000 mL | Freq: Two times a day (BID) | ORAL | 0 refills | Status: DC

## 2020-09-12 MED ORDER — SUMATRIPTAN SUCCINATE 25 MG PO TABS: 25.0000 mg | ORAL_TABLET | ORAL | 1 refills | Status: DC | PRN

## 2020-09-12 NOTE — Progress Notes (Signed)
Subjective:     Patient ID: Megan Torres, female   DOB: 1970/10/06, 50 y.o.   MRN: [id number]  This visit type was conducted due to national recommendations for restrictions regarding the COVID-19 Pandemic (e.g. social distancing) in an effort to limit this patient's exposure and mitigate transmission in our community.  Due to their co-morbid illnesses, this patient is at least at moderate risk for complications without adequate follow up.  This format is felt to be most appropriate for this patient at this time.    Documentation for virtual audio and video telecommunications through [place] encounter:  The patient was located at home. The provider was located in the office. The patient did consent to this visit and is aware of possible charges through their insurance for this visit.  The other persons participating in this telemedicine service were none. Time spent on call was 20 minutes and in review of previous records 20 minutes total.  This virtual service is not related to other E/M service within previous 7 days.   HPI Chief Complaint  Patient presents with  . Covid Positive    As of yesterday has all vaccines  . Fever  . Fatigue  . Cough  . Muscle Pain   She notes cough, intermittent fever, fatigued, muscle aches ,feels like someone crushed her body.  Some nausea, decreased appetite.  No vomiting.  No diarrhea.  Sense of taste if altered.  Coffee tastes bitter.  No wheezing, no SOB.  But gets tired going up and down stairs.   No runny nose.  Has some headache.  Had migraine last week, used some Zofran for nausea.  Has had bad sore throat.  Had high temp few days ago, felt out of it.    Tested positive for covid yesterday, but symptoms started 6 days ago.   Has hx/o migraines, has seen urgent care in past.  Running out of migraine abortive medication.  Needs refills.  Using left over hycodan cough syrup from 04/2020.     Review of Systems As in subjective     Objective:   Physical Exam Due to coronavirus pandemic stay at home measures, patient visit was virtual and they were not examined in person.   Temp (!) 102 F (38.9 C)   Wt 143 lb (64.9 kg)   BMI 27.02 kg/m   General: Well-developed well-nourished no acute distress but ill-appearing No obvious wheezing or labored breathing, answers questions in complete sentences     Assessment:     Encounter Diagnoses  Name Primary?  . COVID-19 virus infection Yes  . Cough   . Body aches   . Other fatigue   . Chronic migraine without aura with status migrainosus, not intractable        Plan:     We discussed her current symptoms.  We discussed supportive therapies below.  I refilled Hycodan which she used back in December for similar respiratory tract infection.  I refilled her Imitrex and Zofran for migraine acute therapy.  She was about with his medications.  We discussed getting reevaluated over the weekend if much worse per tickly with the body aches and fever.  If she continues to get worse she may need some IV fluids.  Currently she is doing pretty good with hydration.  She is not particularly high risk.  She is on day 6 of symptoms, so not sure Paxlovid would be of much help at this point.  .     General recommendations: I  recommend you rest, hydrate well with water and clear fluids throughout the day.   You can use Tylenol for pain or fever You can use over the counter Delsym for cough. You can use over the counter Emetrol or Zofran prescription you have for nausea.     If you are having trouble breathing, if you are very weak, have high fever 103 or higher consistently despite Tylenol, or uncontrollable nausea and vomiting, then call or go to the emergency department.    If you have other questions or have other symptoms or questions you are concerned about then please make a virtual visit  Covid symptoms such as fatigue and cough can linger over 2 weeks, even after the initial  fever, aches, chills, and other initial symptoms.   Self Quarantine: The CDC, Centers for Disease Control has recommended a self quarantine of 5 days from the start of your illness until you are symptom-free including at least 24 hours of no symptoms including no fever, no shortness of breath, and no body aches and chills, by day 5 before returning to work or general contact with the public.  What does self quarantine mean: avoiding contact with people as much as possible.   Particularly in your house, isolate your self from others in a separate room, wear a mask when possible in the room, particularly if coughing a lot.   Have others bring food, water, medications, etc., to your door, but avoid direct contact with your household contacts during this time to avoid spreading the infection to them.   If you have a separate bathroom and living quarters during the next 2 weeks away from others, that would be preferable.    If you can't completely isolate, then wear a mask, wash hands frequently with soap and water for at least 15 seconds, minimize close contact with others, and have a friend or family member check regularly from a distance to make sure you are not getting seriously worse.     You should not be going out in public, should not be going to stores, to work or other public places until all your symptoms have resolved and at least 5 days + 24 hours of no symptoms at all have transpired.   Ideally you should avoid contact with others for a full 5 days if possible.  One of the goals is to limit spread to high risk people; people that are older and elderly, people with multiple health issues like diabetes, heart disease, lung disease, and anybody that has weakened immune systems such as people with cancer or on immunosuppressive therapy.      Megan Torres was seen today for covid positive, fever, fatigue, cough and muscle pain.  Diagnoses and all orders for this visit:  COVID-19 virus  infection  Cough  Body aches  Other fatigue  Chronic migraine without aura with status migrainosus, not intractable  Other orders -     ondansetron (ZOFRAN-ODT) 4 MG disintegrating tablet; Take 1 tablet (4 mg total) by mouth every 6 (six) hours as needed. -     SUMAtriptan (IMITREX) 25 MG tablet; Take 1 tablet (25 mg total) by mouth as needed. -     chlorpheniramine-HYDROcodone (TUSSIONEX PENNKINETIC ER) 10-8 MG/5ML SUER; Take 5 mLs by mouth 2 (two) times daily.  f/u prn

## 2020-09-12 NOTE — Patient Instructions (Signed)
We discussed her current symptoms.  We discussed supportive therapies below.  I refilled Hycodan which she used back in December for similar respiratory tract infection.  I refilled her Imitrex and Zofran for migraine acute therapy.  She was about with his medications.  We discussed getting reevaluated over the weekend if much worse per tickly with the body aches and fever.  If she continues to get worse she may need some IV fluids.  Currently she is doing pretty good with hydration.  She is not particularly high risk.  She is on day 6 of symptoms, so not sure Paxlovid would be of much help at this point.  .    General recommendations: I recommend you rest, hydrate well with water and clear fluids throughout the day.   You can use Tylenol for pain or fever You can use over the counter Delsym for cough. You can use over the counter Emetrol or Zofran prescription you have for nausea.     If you are having trouble breathing, if you are very weak, have high fever 103 or higher consistently despite Tylenol, or uncontrollable nausea and vomiting, then call or go to the emergency department.    If you have other questions or have other symptoms or questions you are concerned about then please make a virtual visit  Covid symptoms such as fatigue and cough can linger over 2 weeks, even after the initial fever, aches, chills, and other initial symptoms.   Self Quarantine: The CDC, Centers for Disease Control has recommended a self quarantine of 5 days from the start of your illness until you are symptom-free including at least 24 hours of no symptoms including no fever, no shortness of breath, and no body aches and chills, by day 5 before returning to work or general contact with the public.  What does self quarantine mean: avoiding contact with people as much as possible.   Particularly in your house, isolate your self from others in a separate room, wear a mask when possible in the room, particularly if  coughing a lot.   Have others bring food, water, medications, etc., to your door, but avoid direct contact with your household contacts during this time to avoid spreading the infection to them.   If you have a separate bathroom and living quarters during the next 2 weeks away from others, that would be preferable.    If you can't completely isolate, then wear a mask, wash hands frequently with soap and water for at least 15 seconds, minimize close contact with others, and have a friend or family member check regularly from a distance to make sure you are not getting seriously worse.     You should not be going out in public, should not be going to stores, to work or other public places until all your symptoms have resolved and at least 5 days + 24 hours of no symptoms at all have transpired.   Ideally you should avoid contact with others for a full 5 days if possible.  One of the goals is to limit spread to high risk people; people that are older and elderly, people with multiple health issues like diabetes, heart disease, lung disease, and anybody that has weakened immune systems such as people with cancer or on immunosuppressive therapy.

## 2020-09-30 ENCOUNTER — Other Ambulatory Visit: Payer: Self-pay | Admitting: Family Medicine

## 2020-09-30 DIAGNOSIS — Z1231 Encounter for screening mammogram for malignant neoplasm of breast: Secondary | ICD-10-CM

## 2020-10-06 ENCOUNTER — Encounter: Payer: Self-pay | Admitting: Family Medicine

## 2020-10-07 MED ORDER — MELOXICAM 15 MG PO TABS: 15.0000 mg | ORAL_TABLET | Freq: Every day | ORAL | 0 refills | Status: DC

## 2020-10-17 ENCOUNTER — Ambulatory Visit
Admission: RE | Admit: 2020-10-17 | Discharge: 2020-10-17 | Disposition: A | Discharge status: Home / Self Care | Payer: Managed Care, Other (non HMO) | Source: Ambulatory Visit | Attending: Family Medicine | Admitting: Family Medicine

## 2020-10-17 ENCOUNTER — Other Ambulatory Visit: Payer: Self-pay

## 2020-10-17 DIAGNOSIS — Z1231 Encounter for screening mammogram for malignant neoplasm of breast: Secondary | ICD-10-CM

## 2020-10-22 ENCOUNTER — Other Ambulatory Visit: Payer: Self-pay

## 2020-10-22 ENCOUNTER — Other Ambulatory Visit: Payer: Managed Care, Other (non HMO)

## 2020-10-22 DIAGNOSIS — Z0000 Encounter for general adult medical examination without abnormal findings: Secondary | ICD-10-CM

## 2020-10-22 DIAGNOSIS — E559 Vitamin D deficiency, unspecified: Secondary | ICD-10-CM

## 2020-10-22 DIAGNOSIS — R7301 Impaired fasting glucose: Secondary | ICD-10-CM

## 2020-10-22 DIAGNOSIS — E039 Hypothyroidism, unspecified: Secondary | ICD-10-CM

## 2020-10-23 ENCOUNTER — Other Ambulatory Visit: Payer: Self-pay

## 2020-10-23 LAB — CBC WITH DIFFERENTIAL/PLATELET
Basophils Absolute: 0 10*3/uL (ref 0.0–0.2)
Basos: 0 %
EOS (ABSOLUTE): 0.1 10*3/uL (ref 0.0–0.4)
Eos: 2 %
Hematocrit: 38.9 % (ref 34.0–46.6)
Hemoglobin: 12.6 g/dL (ref 11.1–15.9)
Immature Grans (Abs): 0 10*3/uL (ref 0.0–0.1)
Immature Granulocytes: 0 %
Lymphocytes Absolute: 2.3 10*3/uL (ref 0.7–3.1)
Lymphs: 32 %
MCH: 27.3 pg (ref 26.6–33.0)
MCHC: 32.4 g/dL (ref 31.5–35.7)
MCV: 84 fL (ref 79–97)
Monocytes Absolute: 0.4 10*3/uL (ref 0.1–0.9)
Monocytes: 6 %
Neutrophils Absolute: 4.3 10*3/uL (ref 1.4–7.0)
Neutrophils: 60 %
Platelets: 328 10*3/uL (ref 150–450)
RBC: 4.62 x10E6/uL (ref 3.77–5.28)
RDW: 13.3 % (ref 11.7–15.4)
WBC: 7.2 10*3/uL (ref 3.4–10.8)

## 2020-10-23 LAB — LIPID PANEL
Chol/HDL Ratio: 4.9 ratio — ABNORMAL HIGH (ref 0.0–4.4)
Cholesterol, Total: 172 mg/dL (ref 100–199)
HDL: 35 mg/dL — ABNORMAL LOW (ref >39)
LDL Chol Calc (NIH): 111 mg/dL — ABNORMAL HIGH (ref 0–99)
Triglycerides: 143 mg/dL (ref 0–149)
VLDL Cholesterol Cal: 26 mg/dL (ref 5–40)

## 2020-10-23 LAB — COMPREHENSIVE METABOLIC PANEL
ALT: 10 IU/L (ref 0–32)
AST: 15 IU/L (ref 0–40)
Albumin/Globulin Ratio: 1.8 (ref 1.2–2.2)
Albumin: 4.5 g/dL (ref 3.8–4.8)
Alkaline Phosphatase: 82 IU/L (ref 44–121)
BUN/Creatinine Ratio: 13 (ref 9–23)
BUN: 9 mg/dL (ref 6–24)
Bilirubin Total: 0.6 mg/dL (ref 0.0–1.2)
CO2: 20 mmol/L (ref 20–29)
Calcium: 9.1 mg/dL (ref 8.7–10.2)
Chloride: 103 mmol/L (ref 96–106)
Creatinine, Ser: 0.69 mg/dL (ref 0.57–1.00)
Globulin, Total: 2.5 g/dL (ref 1.5–4.5)
Glucose: 111 mg/dL — ABNORMAL HIGH (ref 65–99)
Potassium: 4.3 mmol/L (ref 3.5–5.2)
Sodium: 138 mmol/L (ref 134–144)
Total Protein: 7 g/dL (ref 6.0–8.5)
eGFR: 106 mL/min/{1.73_m2} (ref >59)

## 2020-10-23 LAB — HEMOGLOBIN A1C
Est. average glucose Bld gHb Est-mCnc: 120 mg/dL
Hgb A1c MFr Bld: 5.8 % — ABNORMAL HIGH (ref 4.8–5.6)

## 2020-10-23 LAB — TSH: TSH: 5.08 u[IU]/mL — ABNORMAL HIGH (ref 0.450–4.500)

## 2020-10-23 LAB — VITAMIN D 25 HYDROXY (VIT D DEFICIENCY, FRACTURES): Vit D, 25-Hydroxy: 18.7 ng/mL — ABNORMAL LOW (ref 30.0–100.0)

## 2020-10-26 NOTE — Progress Notes (Signed)
Chief Complaint  Patient presents with   Annual Exam    Nonfasting (labs done) annual exam with pelvic. Sees eyes doctor for eye exam. No concerns.     Megan Torres is a 50 y.o. female who presents for a complete physical.  She has the following concerns:  Patient had pain in R shoulder last month. She saw Aart Schulenklopper for PT, got dry needling.  She reached out for refill on meloxicam, took it for a few days after the dry needing, not needing currently.  She had COVID-19 infection in Late May/early June. She still has some brain fog, no other residual symptoms. She had fatigue for 2 weeks, better now.  BP elevated today--didn't sleep much last night, and got feedback this morning on something, that she overlooked some things at work (having some issues since COVID), which bothers her.  Last seen in 05/2020 with complaint of fatigue, and had cellulitis/eye infection shortly after, treated by eye doctor. Lab eval at that time showed normal TSH (2.87) and low Vitamin D (20.7) and normal CBC.  She had additional labs drawn prior to today's visit, see below,  Vitamin D deficiency:  Her last level was 20.7 in 05/2020. She had taken 4000 IU in December, but then decreased to 2000 IU in January.  Previously when values were low, after completion of prescription D she was advised to take 4000-5000 IU upon completion. In February she was treated with another 12 weeks of prescription, and advised to take 5000 IU upon completion.  Reports she has been taking 2000 IU daily.  She has h/o hypothyroidism--had been off meds for many years (stopped 2011), previously monitored by Dr. Dwyane Dee.  Started back on meds during evaluation for infertility, for 6 months, stopped in 12/2015, and took it one other time for a bit when she was having more hair loss. Has been off meds for a while.  TSH has remained mildly elevated, but asymptomatic and declined treatment (subclinical hypothyroidism).  She has h/o +TPO  antibodies.  TSH in 05/2020 was normal at 2.870.  Heat in feet comes and goes (not a burning pain, just temperature, needs to kick her feet out from the covers), no cold intolerance. No changes to hair/skin or nails.  Loses hair all the time, but not thinning. Moods are good. Some fatigue related to COVID recently, but improving.  Impaired fasting glucose:  She has had elevated fasting sugars (101 last year, with A1c of 5.8%, 107 the two years prior). She tries to limit the sugar/sweets/carbs in her diet. She doesn't eat sweets.  She eats some rice or naan. Hasn't been exercising since recent COVID.  H/o Menstrual migraines:  These resolved, no longer having migraines with cycles. She still occasionally gets a migraine, last was 2 months ago. She used imitrex and went to bed. Hasn't needed any since. She states she wasn't able to get the last imitrex due to insurance issues, did get the zomig (prescribed when she had COVID).  Immunization History  Administered Date(s) Administered   Influenza Split 02/01/2014   Influenza, Seasonal, Injecte, Preservative Fre 06/08/2012   Influenza,inj,Quad PF,6+ Mos 12/02/2016, 12/19/2019   PFIZER(Purple Top)SARS-COV-2 Vaccination 06/20/2019, 07/11/2019, 04/03/2020   Tdap 06/08/2012, 09/27/2016, 03/31/2017    MMR immune. Varicella previously found to be immune. Last Pap smear: 09/2016, normal, no high risk HPV present Last mammogram: 10/2020 Last colonoscopy: 12/2019, 1 tubular adenoma; 7 year f/u recommended Last DEXA: never Dentist: every 3 months  Ophtho: yearly, due Exercise:  walked 4-6 miles/day in March/April. After getting COVID, activity has been limited. No weight-bearing exercise. Plays kickball (on team through work) More fried foods recently.   PMH, PSH, SH and FH were reviewed and updated  Outpatient Encounter Medications as of 10/27/2020  Medication Sig   VITAMIN D PO Take 2,000 Units by mouth.   meloxicam (MOBIC) 15 MG tablet Take 1  tablet (15 mg total) by mouth daily. (Patient not taking: Reported on 10/27/2020)   SUMAtriptan (IMITREX) 25 MG tablet Take 1 tablet (25 mg total) by mouth as needed. (Patient not taking: Reported on 10/27/2020)   Vitamin D, Ergocalciferol, (DRISDOL) 1.25 MG (50000 UNIT) CAPS capsule Take 1 capsule (50,000 Units total) by mouth every 7 (seven) days. (Patient not taking: No sig reported)   [DISCONTINUED] chlorpheniramine-HYDROcodone (TUSSIONEX PENNKINETIC ER) 10-8 MG/5ML SUER Take 5 mLs by mouth 2 (two) times daily.   [DISCONTINUED] ondansetron (ZOFRAN-ODT) 4 MG disintegrating tablet Take 1 tablet (4 mg total) by mouth every 6 (six) hours as needed.   No facility-administered encounter medications on file as of 10/27/2020.   No Known Allergies   ROS:  The patient denies anorexia, fever, vision changes, decreased hearing, ear pain, sore throat, breast concerns, chest pain, palpitations, dizziness, syncope, dyspnea on exertion, cough, swelling, nausea, vomiting, diarrhea, constipation, abdominal pain, melena, hematochezia, indigestion/heartburn, hematuria, incontinence, dysuria, irregular menstrual cycles, vaginal discharge, odor or itch, genital lesions, joint pains, numbness, tingling, weakness, tremor, suspicious skin lesions, anxiety, abnormal bleeding/bruising, or enlarged lymph nodes. Denies joint pains. No headaches in 2 months R shoulder pain resolved (comes/goes)   PHYSICAL EXAM:  BP (!) 160/90   Pulse 80   Ht 5' 0.5" (1.537 m)   Wt 144 lb 9.6 oz (65.6 kg)   LMP 10/21/2020   BMI 27.78 kg/m   142/86 on repeat by MD  Wt Readings from Last 3 Encounters:  10/27/20 144 lb 9.6 oz (65.6 kg)  09/12/20 143 lb (64.9 kg)  05/14/20 148 lb 3.2 oz (67.2 kg)   10/2019 147#  General Appearance:     Alert, cooperative, no distress, appears stated age   Head:     Normocephalic, without obvious abnormality, atraumatic   Eyes:     PERRL, conjunctiva/corneas clear, EOM's intact, fundi benign    Ears:     Normal TM's and external ear canals   Nose:    Not examined (wearing mask due to COVID-19 pandemic)  Throat:    Not examined (wearing mask due to COVID-19 pandemic)  Neck:    Supple, no lymphadenopathy;  thyroid: borderline size, no  tenderness/nodules; no carotid bruit or JVD   Back:     Spine nontender, no curvature, ROM normal, no CVA tenderness   Lungs:      Clear to auscultation bilaterally without wheezes, rales or ronchi; respirations unlabored   Chest Wall:     No deformity, nontender   Heart:     Regular rate and rhythm, S1 and S2 normal, no murmur, rub or gallop   Breast Exam:    No nipple discharge, inversion, skin dimpling, breast masses or axillary lymphadenopathy.    Abdomen:      Soft, non-tender, nondistended, normoactive bowel sounds, no masses, no hepatosplenomegaly   Genitalia:    External genitalia is normal without lesions. BUS/vagina normal, no abnormal discharge.  Uterus is normal in size, no adnexal masses or tenderness.  No cervical motion tenderness.  Pap not performed  Rectal:    Normal sphincter tone, no masses.  Heme  negative stool in vault  Extremities:    No clubbing, cyanosis or edema.   Pulses:    2+ and symmetric all extremities   Skin:    Skin color, texture, turgor normal, no rashes or lesions   Lymph nodes:    Cervical, supraclavicular, and axillary nodes normal   Neurologic:    Normal strength, sensation and gait; reflexes 2+ and symmetric throughout                     Psych:   Normal mood, affect, hygiene and grooming    Lab Results  Component Value Date   HGBA1C 5.8 (H) 10/22/2020     Chemistry      Component Value Date/Time   NA 138 10/22/2020 0830   K 4.3 10/22/2020 0830   CL 103 10/22/2020 0830   CO2 20 10/22/2020 0830   BUN 9 10/22/2020 0830   CREATININE 0.69 10/22/2020 0830   CREATININE 0.58 09/30/2016 0754      Component Value Date/Time   CALCIUM 9.1 10/22/2020 0830   ALKPHOS 82 10/22/2020 0830   AST 15 10/22/2020 0830    ALT 10 10/22/2020 0830   BILITOT 0.6 10/22/2020 0830     Fasting glucose 111  Vitamin D-OH 18.7  Lab Results  Component Value Date   TSH 5.080 (H) 10/22/2020   Lab Results  Component Value Date   CHOL 172 10/22/2020   HDL 35 (L) 10/22/2020   LDLCALC 111 (H) 10/22/2020   TRIG 143 10/22/2020   CHOLHDL 4.9 (H) 10/22/2020   Lab Results  Component Value Date   WBC 7.2 10/22/2020   HGB 12.6 10/22/2020   HCT 38.9 10/22/2020   MCV 84 10/22/2020   PLT 328 10/22/2020    ASSESSMENT/PLAN:   Annual physical exam - Plan: POCT Urinalysis DIP (Proadvantage Device)  Impaired fasting glucose - counseled re: diet, exercise  Hypothyroidism, unspecified type - asymptomatic (subclinical)  Vitamin D deficiency - hasn't been taking adequate doses after rx. Treat with another 12 weeks of rx, then start 5000 IU and cont longterm - Plan: Vitamin D, Ergocalciferol, (DRISDOL) 1.25 MG (50000 UNIT) CAPS capsule  Chronic migraine without aura with status migrainosus, not intractable - infrequent. Imitrex prn  Elevated blood pressure reading in office without diagnosis of hypertension - poss related to poor sleep/stress at work. Encouraged low Na diet, exercise, and to check BP elsewhere. f/u if remains >135/85  Discussed monthly self breast exams and yearly mammograms; at least 30 minutes of aerobic activity at least 5 days/week, weight-bearing exercise 2x/wk; proper sunscreen use reviewed; healthy diet, including goals of calcium and vitamin D intake and alcohol recommendations (less than or equal to 1 drink/day) reviewed; regular seatbelt use; changing batteries in smoke detectors.  Immunization recommendations discussed, UTD. Continue yearly flu shots. Shingrix at age 30. Colon cancer screening is UTD, repeat colonoscopy due 2028. Pap next year    CPE 1 year with fasting labs prior--cbc, c-met, lipid, TSH, D

## 2020-10-26 NOTE — Patient Instructions (Addendum)
  HEALTH MAINTENANCE RECOMMENDATIONS:  It is recommended that you get at least 30 minutes of aerobic exercise at least 5 days/week (for weight loss, you may need as much as 60-90 minutes). This can be any activity that gets your heart rate up. This can be divided in 10-15 minute intervals if needed, but try and build up your endurance at least once a week.  Weight bearing exercise is also recommended twice weekly.  Eat a healthy diet with lots of vegetables, fruits and fiber.  "Colorful" foods have a lot of vitamins (ie green vegetables, tomatoes, red peppers, etc).  Limit sweet tea, regular sodas and alcoholic beverages, all of which has a lot of calories and sugar.  Up to 1 alcoholic drink daily may be beneficial for women (unless trying to lose weight, watch sugars).  Drink a lot of water.  Calcium recommendations are 1200-1500 mg daily (1500 mg for postmenopausal women or women without ovaries), and vitamin D 1000 IU daily.  This should be obtained from diet and/or supplements (vitamins), and calcium should not be taken all at once, but in divided doses.  Monthly self breast exams and yearly mammograms for women over the age of 12 is recommended.  Sunscreen of at least SPF 30 should be used on all sun-exposed parts of the skin when outside between the hours of 10 am and 4 pm (not just when at beach or pool, but even with exercise, golf, tennis, and yard work!)  Use a sunscreen that says "broad spectrum" so it covers both UVA and UVB rays, and make sure to reapply every 1-2 hours.  Remember to change the batteries in your smoke detectors when changing your clock times in the spring and fall. Carbon monoxide detectors are recommended for your home.  Use your seat belt every time you are in a car, and please drive safely and not be distracted with cell phones and texting while driving.  Your blood pressure was very high today. Please be sure to limit the salt in your diet, get regular exercise and  periodically monitor your blood pressure elsewhere (at a pharmacy or other blood pressure cuff, not through the watch). If your blood pressure is consistently running over 135-140/85-90, please bring your list and return to discuss.  Goal is <130/80.  Take the prescription vitamin D for another 12 weeks. Upon completion, please take 5000 IU daily (you can double up on the 2000 IU that you have left until it is gone, then switch to the 5000 IU and continue that dose long-term).  Cut back on fried foods. Resume regular exercise, including weight-bearing exercise.

## 2020-10-27 ENCOUNTER — Encounter: Payer: Self-pay | Admitting: Family Medicine

## 2020-10-27 ENCOUNTER — Ambulatory Visit (INDEPENDENT_AMBULATORY_CARE_PROVIDER_SITE_OTHER): Payer: Managed Care, Other (non HMO) | Admitting: Family Medicine

## 2020-10-27 ENCOUNTER — Other Ambulatory Visit: Payer: Self-pay

## 2020-10-27 VITALS — BP 142/86 | HR 80 | Ht 60.5 in | Wt 144.6 lb

## 2020-10-27 DIAGNOSIS — E559 Vitamin D deficiency, unspecified: Secondary | ICD-10-CM | POA: Diagnosis not present

## 2020-10-27 DIAGNOSIS — E039 Hypothyroidism, unspecified: Secondary | ICD-10-CM | POA: Diagnosis not present

## 2020-10-27 DIAGNOSIS — R030 Elevated blood-pressure reading, without diagnosis of hypertension: Secondary | ICD-10-CM

## 2020-10-27 DIAGNOSIS — R7301 Impaired fasting glucose: Secondary | ICD-10-CM

## 2020-10-27 DIAGNOSIS — G43701 Chronic migraine without aura, not intractable, with status migrainosus: Secondary | ICD-10-CM | POA: Diagnosis not present

## 2020-10-27 DIAGNOSIS — Z0000 Encounter for general adult medical examination without abnormal findings: Secondary | ICD-10-CM | POA: Diagnosis not present

## 2020-10-27 LAB — POCT URINALYSIS DIP (PROADVANTAGE DEVICE)
Bilirubin, UA: NEGATIVE
Blood, UA: NEGATIVE
Glucose, UA: NEGATIVE mg/dL
Ketones, POC UA: NEGATIVE mg/dL
Leukocytes, UA: NEGATIVE
Nitrite, UA: NEGATIVE
Protein Ur, POC: NEGATIVE mg/dL
Specific Gravity, Urine: 1.02
Urobilinogen, Ur: NEGATIVE
pH, UA: 6 (ref 5.0–8.0)

## 2020-10-27 MED ORDER — VITAMIN D (ERGOCALCIFEROL) 1.25 MG (50000 UNIT) PO CAPS: 50000.0000 [IU] | ORAL_CAPSULE | ORAL | 0 refills | Status: DC

## 2021-05-12 ENCOUNTER — Encounter: Payer: Self-pay | Admitting: Family Medicine

## 2021-05-12 ENCOUNTER — Other Ambulatory Visit: Payer: Self-pay

## 2021-05-12 MED ORDER — SUMATRIPTAN SUCCINATE 25 MG PO TABS: 25.0000 mg | ORAL_TABLET | Freq: Every day | ORAL | 0 refills | Status: DC | PRN

## 2021-05-12 MED ORDER — SUMATRIPTAN SUCCINATE 25 MG PO TABS: 25.0000 mg | ORAL_TABLET | ORAL | 0 refills | Status: DC | PRN

## 2021-05-12 NOTE — Telephone Encounter (Signed)
Fixed prescription and verbally notified pt

## 2021-05-13 ENCOUNTER — Other Ambulatory Visit: Payer: Self-pay

## 2021-05-13 MED ORDER — SUMATRIPTAN SUCCINATE 25 MG PO TABS: 25.0000 mg | ORAL_TABLET | Freq: Every day | ORAL | 0 refills | Status: DC

## 2021-06-08 ENCOUNTER — Other Ambulatory Visit: Payer: Self-pay | Admitting: Medical

## 2021-06-08 ENCOUNTER — Telehealth: Payer: Self-pay

## 2021-06-08 MED ORDER — PROMETHAZINE-DM 6.25-15 MG/5ML PO SYRP: 5.0000 mL | ORAL_SOLUTION | Freq: Four times a day (QID) | ORAL | 0 refills | Status: DC | PRN

## 2021-06-08 MED ORDER — SUMATRIPTAN SUCCINATE 25 MG PO TABS: 25.0000 mg | ORAL_TABLET | Freq: Every day | ORAL | 0 refills | Status: DC

## 2021-06-08 NOTE — Telephone Encounter (Signed)
Patient advised.

## 2021-06-08 NOTE — Telephone Encounter (Signed)
While I was on the phone with her about the imitrex she mentioned that she tested positive for covid last Friday. She was given promethazine-DM and asked if it could be refilled by someone here. I told her she would need a visit. She said she doesn't need visit as she is recovering. I explained that it is required for any refills. She asked if there was anything else that you could send in for her cough?

## 2021-06-08 NOTE — Telephone Encounter (Signed)
Left message asking patient to please call back and let me know if she requested this medication and if so, how many does she typically use per month.

## 2021-06-08 NOTE — Telephone Encounter (Signed)
Is this okay to refill? 

## 2021-06-08 NOTE — Telephone Encounter (Signed)
Recv'd fax request from [facility] req refill Sumatriptan 25mg  90 day supply with 3 refills

## 2021-06-08 NOTE — Telephone Encounter (Signed)
Spoke with patient and she just finished the 10 that Dr. Tomi Bamberger gave her in Oct. She said she didn't pick up the ones that were sent earlier this month as her insurance changed and she is required to use [facility] for maintenance type meds now.

## 2021-07-03 ENCOUNTER — Other Ambulatory Visit: Payer: Self-pay | Admitting: Family Medicine

## 2021-07-03 ENCOUNTER — Encounter: Payer: Self-pay | Admitting: Family Medicine

## 2021-07-03 DIAGNOSIS — Z1231 Encounter for screening mammogram for malignant neoplasm of breast: Secondary | ICD-10-CM

## 2021-09-08 IMAGING — MG MM DIGITAL SCREENING BILAT W/ TOMO AND CAD
8 series · 8 of 24 positions shown · non-contrast
Comparison: Previous exam(s).

CLINICAL DATA: Screening.

EXAM:
DIGITAL SCREENING BILATERAL MAMMOGRAM WITH TOMOSYNTHESIS AND CAD
TECHNIQUE: Bilateral screening digital craniocaudal and mediolateral oblique
mammograms were obtained. Bilateral screening digital breast
tomosynthesis was performed. The images were evaluated with
computer-aided detection.

[R CC synth-2D]
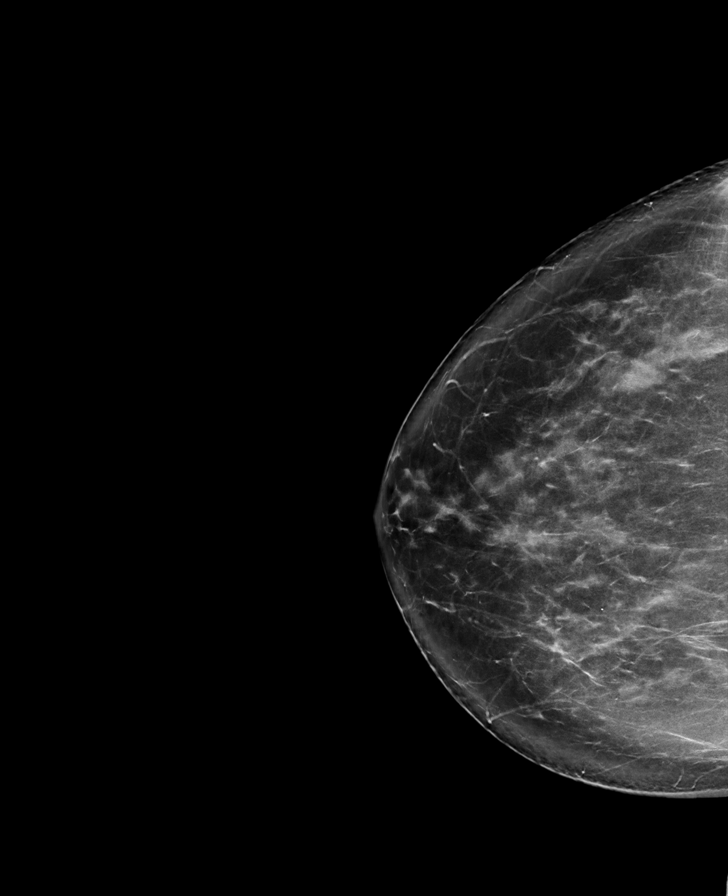

[R MLO synth-2D]
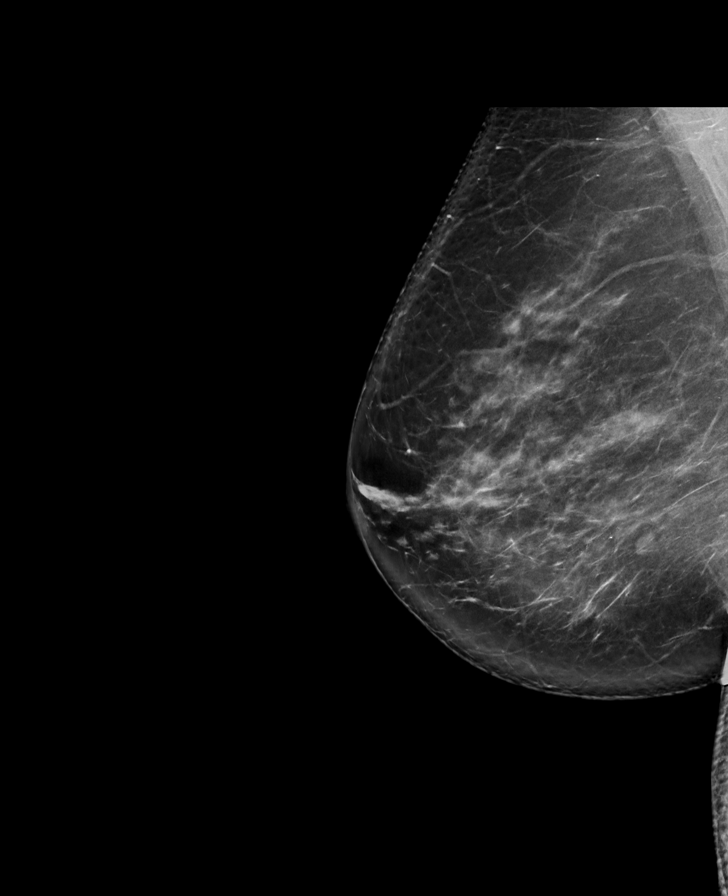

[L MLO synth-2D]
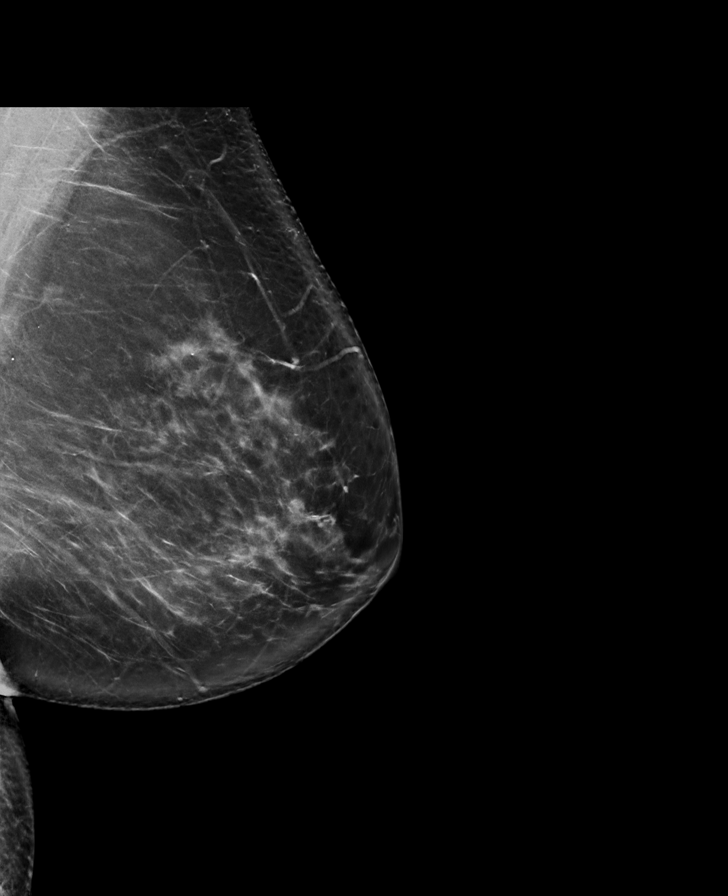

[L CC synth-2D]
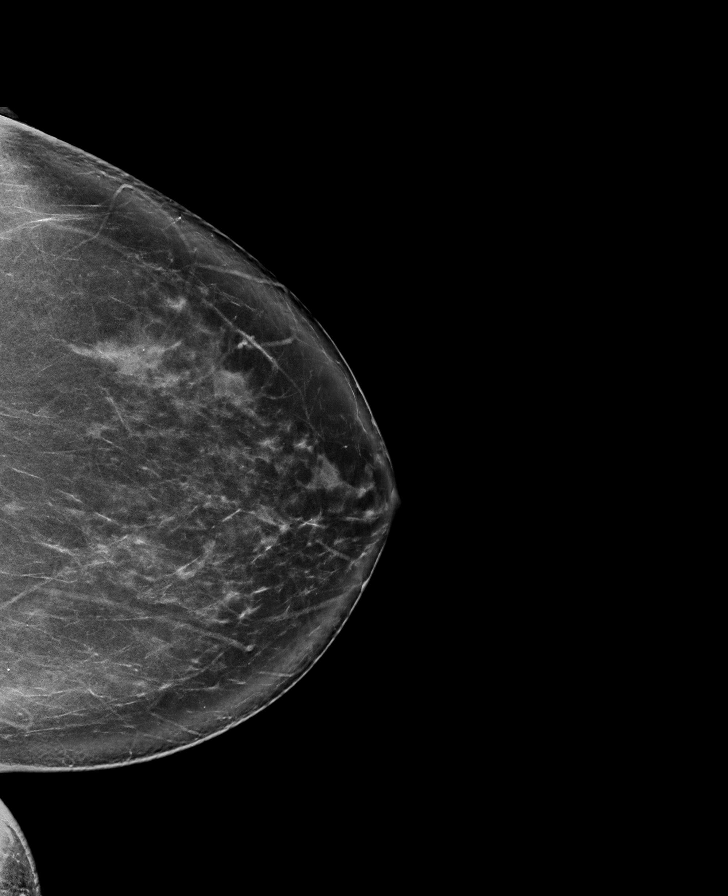

[R MLO tomo · tomo slice 47/94.0]
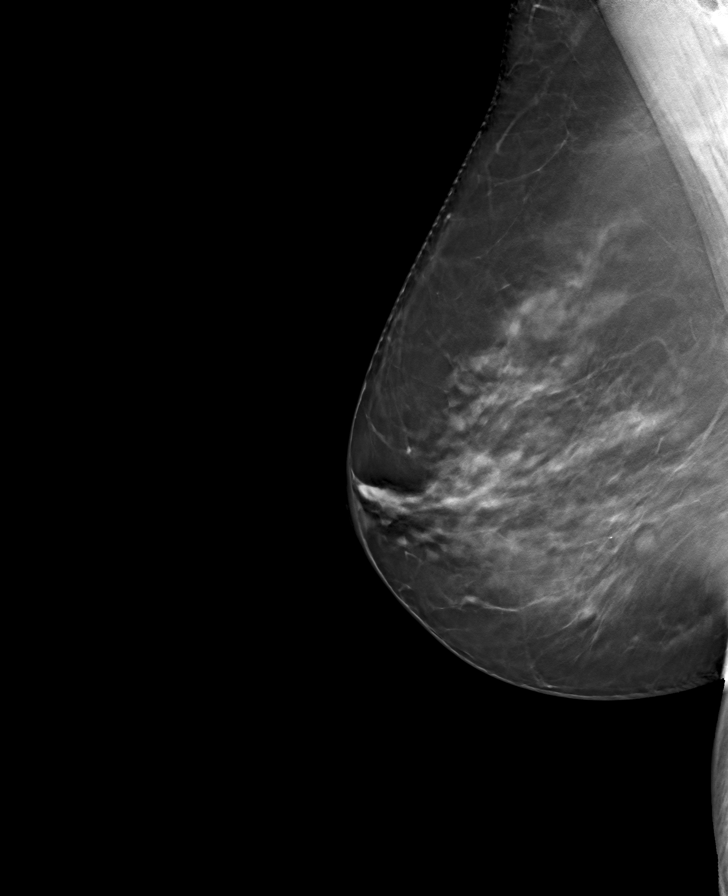

[L MLO tomo · tomo slice 47/94.0]
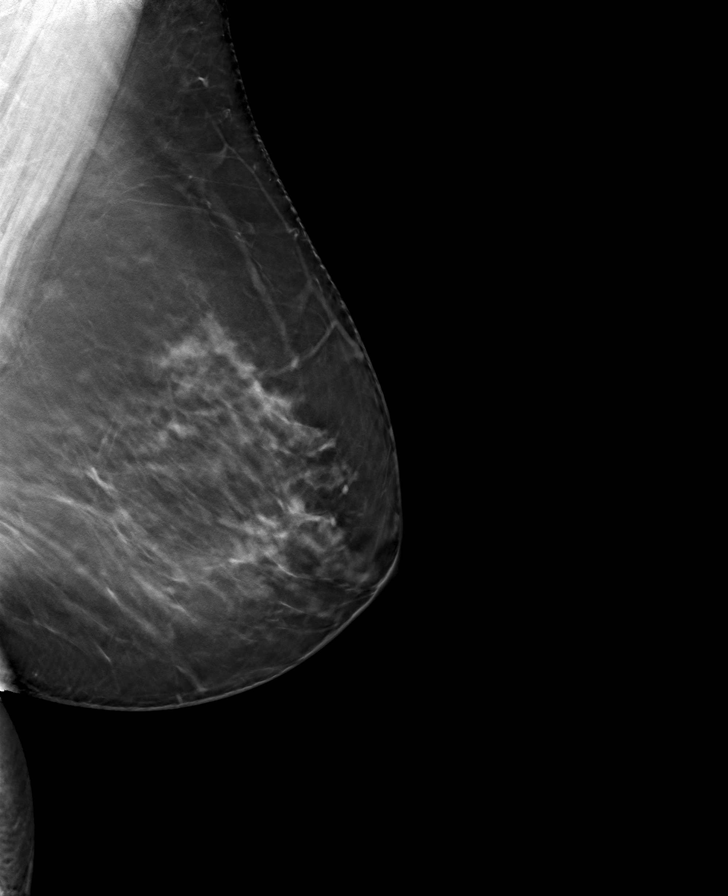

[L CC tomo · tomo slice 46/91.0]
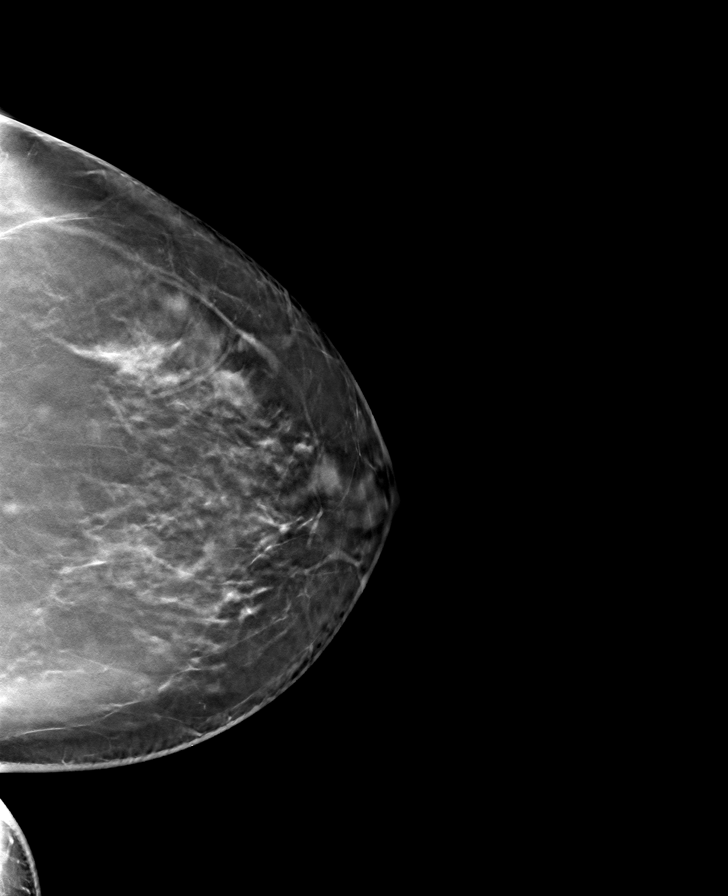

[R CC tomo · tomo slice 45/88.0]
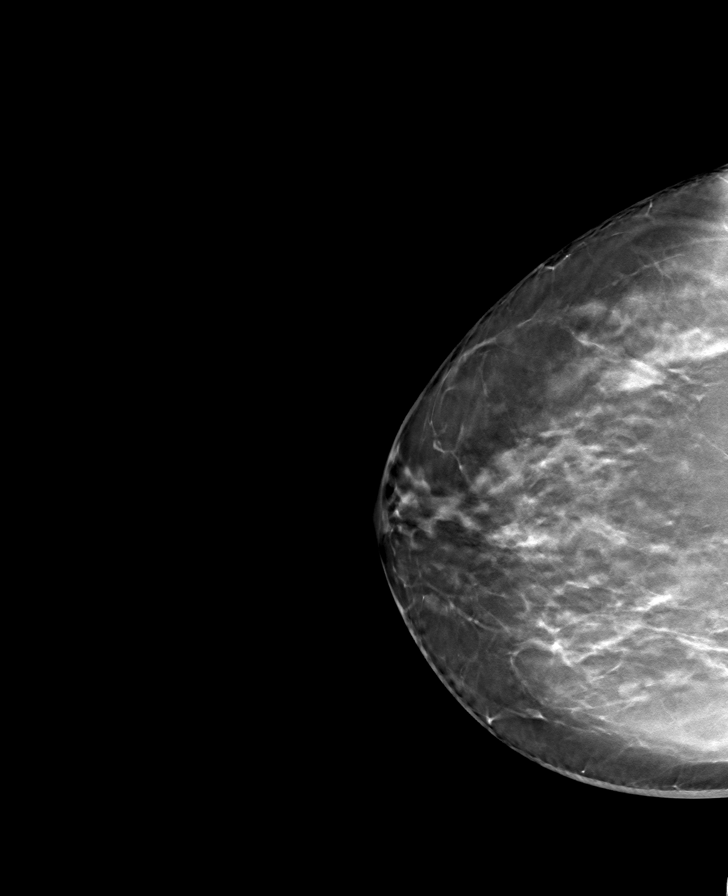

[8 of 24 positions shown; findings below may reference images not displayed]

ACR Breast Density Category b: There are scattered areas of
fibroglandular density.
FINDINGS: There are no findings suspicious for malignancy.
IMPRESSION: No mammographic evidence of malignancy. A result letter of this
screening mammogram will be mailed directly to the patient.

RECOMMENDATION:
Screening mammogram in one year. (Code:51-O-LD2)

BI-RADS CATEGORY  1: Negative.

## 2021-10-19 ENCOUNTER — Ambulatory Visit
Admission: RE | Admit: 2021-10-19 | Discharge: 2021-10-19 | Disposition: A | Discharge status: Home / Self Care | Payer: BC Managed Care – PPO | Source: Ambulatory Visit | Attending: Family Medicine | Admitting: Family Medicine

## 2021-10-19 DIAGNOSIS — Z1231 Encounter for screening mammogram for malignant neoplasm of breast: Secondary | ICD-10-CM

## 2021-10-30 ENCOUNTER — Other Ambulatory Visit: Payer: BC Managed Care – PPO

## 2021-10-30 DIAGNOSIS — Z0000 Encounter for general adult medical examination without abnormal findings: Secondary | ICD-10-CM

## 2021-10-30 DIAGNOSIS — R7301 Impaired fasting glucose: Secondary | ICD-10-CM

## 2021-10-30 DIAGNOSIS — E559 Vitamin D deficiency, unspecified: Secondary | ICD-10-CM

## 2021-10-30 DIAGNOSIS — E039 Hypothyroidism, unspecified: Secondary | ICD-10-CM

## 2021-10-31 LAB — COMPREHENSIVE METABOLIC PANEL
ALT: 15 IU/L (ref 0–32)
AST: 16 IU/L (ref 0–40)
Albumin/Globulin Ratio: 2 (ref 1.2–2.2)
Albumin: 4.5 g/dL (ref 3.9–4.9)
Alkaline Phosphatase: 80 IU/L (ref 44–121)
BUN/Creatinine Ratio: 16 (ref 9–23)
BUN: 10 mg/dL (ref 6–24)
Bilirubin Total: 0.6 mg/dL (ref 0.0–1.2)
CO2: 18 mmol/L — ABNORMAL LOW (ref 20–29)
Calcium: 9.1 mg/dL (ref 8.7–10.2)
Chloride: 106 mmol/L (ref 96–106)
Creatinine, Ser: 0.62 mg/dL (ref 0.57–1.00)
Globulin, Total: 2.3 g/dL (ref 1.5–4.5)
Glucose: 118 mg/dL — ABNORMAL HIGH (ref 70–99)
Potassium: 4.3 mmol/L (ref 3.5–5.2)
Sodium: 138 mmol/L (ref 134–144)
Total Protein: 6.8 g/dL (ref 6.0–8.5)
eGFR: 108 mL/min/{1.73_m2} (ref >59)

## 2021-10-31 LAB — CBC WITH DIFFERENTIAL/PLATELET
Basophils Absolute: 0 10*3/uL (ref 0.0–0.2)
Basos: 0 %
EOS (ABSOLUTE): 0.1 10*3/uL (ref 0.0–0.4)
Eos: 2 %
Hematocrit: 38.6 % (ref 34.0–46.6)
Hemoglobin: 12.9 g/dL (ref 11.1–15.9)
Immature Grans (Abs): 0 10*3/uL (ref 0.0–0.1)
Immature Granulocytes: 0 %
Lymphocytes Absolute: 2.2 10*3/uL (ref 0.7–3.1)
Lymphs: 37 %
MCH: 27.8 pg (ref 26.6–33.0)
MCHC: 33.4 g/dL (ref 31.5–35.7)
MCV: 83 fL (ref 79–97)
Monocytes Absolute: 0.5 10*3/uL (ref 0.1–0.9)
Monocytes: 8 %
Neutrophils Absolute: 3.1 10*3/uL (ref 1.4–7.0)
Neutrophils: 53 %
Platelets: 282 10*3/uL (ref 150–450)
RBC: 4.64 x10E6/uL (ref 3.77–5.28)
RDW: 13 % (ref 11.7–15.4)
WBC: 5.9 10*3/uL (ref 3.4–10.8)

## 2021-10-31 LAB — LIPID PANEL
Chol/HDL Ratio: 5.6 ratio — ABNORMAL HIGH (ref 0.0–4.4)
Cholesterol, Total: 179 mg/dL (ref 100–199)
HDL: 32 mg/dL — ABNORMAL LOW (ref >39)
LDL Chol Calc (NIH): 122 mg/dL — ABNORMAL HIGH (ref 0–99)
Triglycerides: 141 mg/dL (ref 0–149)
VLDL Cholesterol Cal: 25 mg/dL (ref 5–40)

## 2021-10-31 LAB — VITAMIN D 25 HYDROXY (VIT D DEFICIENCY, FRACTURES): Vit D, 25-Hydroxy: 20.4 ng/mL — ABNORMAL LOW (ref 30.0–100.0)

## 2021-10-31 LAB — TSH: TSH: 6.14 u[IU]/mL — ABNORMAL HIGH (ref 0.450–4.500)

## 2021-11-03 NOTE — Patient Instructions (Incomplete)
  HEALTH MAINTENANCE RECOMMENDATIONS:  It is recommended that you get at least 30 minutes of aerobic exercise at least 5 days/week (for weight loss, you may need as much as 60-90 minutes). This can be any activity that gets your heart rate up. This can be divided in 10-15 minute intervals if needed, but try and build up your endurance at least once a week.  Weight bearing exercise is also recommended twice weekly.  Eat a healthy diet with lots of vegetables, fruits and fiber.  "Colorful" foods have a lot of vitamins (ie green vegetables, tomatoes, red peppers, etc).  Limit sweet tea, regular sodas and alcoholic beverages, all of which has a lot of calories and sugar.  Up to 1 alcoholic drink daily may be beneficial for women (unless trying to lose weight, watch sugars).  Drink a lot of water.  Calcium recommendations are 1200-1500 mg daily (1500 mg for postmenopausal women or women without ovaries), and vitamin D 1000 IU daily.  This should be obtained from diet and/or supplements (vitamins), and calcium should not be taken all at once, but in divided doses.  Monthly self breast exams and yearly mammograms for women over the age of 63 is recommended.  Sunscreen of at least SPF 30 should be used on all sun-exposed parts of the skin when outside between the hours of 10 am and 4 pm (not just when at beach or pool, but even with exercise, golf, tennis, and yard work!)  Use a sunscreen that says "broad spectrum" so it covers both UVA and UVB rays, and make sure to reapply every 1-2 hours.  Remember to change the batteries in your smoke detectors when changing your clock times in the spring and fall. Carbon monoxide detectors are recommended for your home.  Use your seat belt every time you are in a car, and please drive safely and not be distracted with cell phones and texting while driving.  Yearly flu shots are recommended in the Fall. Updated bivalent COVID booster is recommended, when available in  the Fall.  Please increase your vitamin D intake--your level was low.   You need to remember to take the 5000 IU EVERY DAY.  Try and avoid stairs--do more flat walking instead, or exercise bike.  Please limit sodium in your diet. Continue to exercise regularly (try daily, but not the stairs). If you are able to monitor your blood pressure, write down the values and bring the list to you visit in 2 weeks.  If you purchase a monitor, bring the monitor with you so that we can assess the accuracy of the readings.

## 2021-11-03 NOTE — Progress Notes (Unsigned)
No chief complaint on file.   Megan Torres is a 51 y.o. female who presents for a complete physical.   She has the following concerns:  She sent a message in March with complaints of intense hunger pangs, nausea and stomach ache. Suggested trial of prilosec.  She had COVID-19 for the third time in February 2023.    Vitamin D deficiency:  Her last level was 18.7 in 10/2020.  She had been taking 2000 IU daily (after completing another 12 week course of prescription D). She was treated with another 12 weeks of prescription, and advised to increase her long-term D3 dose to 5000 IU daily. She has been taking  She has h/o hypothyroidism--had been off meds for many years (stopped 2011), previously monitored by Megan Torres.  Started back on meds during evaluation for infertility, for 6 months, stopped in 12/2015, and took it one other time for a bit when she was having more hair loss. Has been off meds for a while.  TSH has remained mildly elevated, but asymptomatic and declined treatment (subclinical hypothyroidism).  She has h/o +TPO antibodies.  TSH in 05/2020 was normal at 2.870, 5.080 in 10/2020.  Heat in feet comes and goes (not a burning pain, just temperature, needs to kick her feet out from the covers), no cold intolerance. No changes to hair/skin or nails.  Loses hair all the time, but not thinning. Moods are good.  Impaired fasting glucose:  She has had elevated fasting sugars (111 last year), A1c of 5.8% She tries to limit the sugar/sweets/carbs in her diet. She doesn't eat sweets.  She eats some rice or naan.  H/o Menstrual migraines:  These resolved, no longer having migraines with cycles. She still occasionally gets a migraine. She has imitrex to use prn.  Immunization History  Administered Date(s) Administered   Influenza Split 02/01/2014   Influenza, Seasonal, Injecte, Preservative Fre 06/08/2012   Influenza,inj,Quad PF,6+ Mos 12/02/2016, 12/19/2019   PFIZER(Purple  Top)SARS-COV-2 Vaccination 06/20/2019, 07/11/2019, 04/03/2020   Tdap 06/08/2012, 09/27/2016, 03/31/2017   MMR immune. Varicella previously found to be immune. Last Pap smear: 09/2016, normal, no high risk HPV present Last mammogram: 10/2021 Last colonoscopy: 12/2019, 1 tubular adenoma; 7 year f/u recommended Last DEXA: never Dentist: every 3 months  Ophtho: yearly Exercise:   walked 4-6 miles/day No weight-bearing exercise. Plays kickball (on team through work)    PMH, [place], [place] and FH were reviewed and updated    ROS:  The patient denies anorexia, fever, vision changes, decreased hearing, ear pain, sore throat, breast concerns, chest pain, palpitations, dizziness, syncope, dyspnea on exertion, cough, swelling, nausea, vomiting, diarrhea, constipation, abdominal pain, melena, hematochezia, indigestion/heartburn, hematuria, incontinence, dysuria, irregular menstrual cycles, vaginal discharge, odor or itch, genital lesions, joint pains, numbness, tingling, weakness, tremor, suspicious skin lesions, anxiety, abnormal bleeding/bruising, or enlarged lymph nodes. Denies joint pains.  Migraines Cycles?? Hot flashes?   PHYSICAL EXAM:  LMP 10/21/2020   Wt Readings from Last 3 Encounters:  10/27/20 144 lb 9.6 oz (65.6 kg)  09/12/20 143 lb (64.9 kg)  05/14/20 148 lb 3.2 oz (67.2 kg)   General Appearance:     Alert, cooperative, no distress, appears stated age   Head:     Normocephalic, without obvious abnormality, atraumatic   Eyes:     PERRL, conjunctiva/corneas clear, EOM's intact, fundi benign   Ears:     Normal TM's and external ear canals   Nose:    Normal, no drainage or sinus tenderness  Throat:  Normal mucosa, no lesions  Neck:    Supple, no lymphadenopathy;  thyroid: borderline size, no  tenderness/nodules; no carotid bruit or JVD   Back:     Spine nontender, no curvature, ROM normal, no CVA tenderness   Lungs:      Clear to auscultation bilaterally without wheezes, rales or  ronchi; respirations unlabored   Chest Wall:     No deformity, nontender   Heart:     Regular rate and rhythm, S1 and S2 normal, no murmur, rub or gallop   Breast Exam:    No nipple discharge, inversion, skin dimpling, breast masses or axillary lymphadenopathy.    Abdomen:      Soft, non-tender, nondistended, normoactive bowel sounds, no masses, no hepatosplenomegaly   Genitalia:    External genitalia is normal without lesions. BUS/vagina normal, no abnormal discharge.  Uterus is normal in size, no adnexal masses or tenderness.  No cervical lesions, discharge or cervical motion tenderness.  Pap performed  Rectal:    Normal sphincter tone, no masses.  Heme negative stool in vault  Extremities:    No clubbing, cyanosis or edema.   Pulses:    2+ and symmetric all extremities   Skin:    Skin color, texture, turgor normal, no rashes or lesions   Lymph nodes:    Cervical, supraclavicular, inguinal and axillary nodes normal   Neurologic:    Normal strength, sensation and gait; reflexes 2+ and symmetric throughout                     Psych:   Normal mood, affect, hygiene and grooming    Lab Results  Component Value Date   HGBA1C 5.8 (H) 10/22/2020     Chemistry      Component Value Date/Time   NA 138 10/30/2021 0824   K 4.3 10/30/2021 0824   CL 106 10/30/2021 0824   CO2 18 (L) 10/30/2021 0824   BUN 10 10/30/2021 0824   CREATININE 0.62 10/30/2021 0824   CREATININE 0.58 09/30/2016 0754      Component Value Date/Time   CALCIUM 9.1 10/30/2021 0824   ALKPHOS 80 10/30/2021 0824   AST 16 10/30/2021 0824   ALT 15 10/30/2021 0824   BILITOT 0.6 10/30/2021 0824     Fasting glucose 118  Vitamin D-OH 20.4  Lab Results  Component Value Date   TSH 6.140 (H) 10/30/2021   Lab Results  Component Value Date   CHOL 179 10/30/2021   HDL 32 (L) 10/30/2021   LDLCALC 122 (H) 10/30/2021   TRIG 141 10/30/2021   CHOLHDL 5.6 (H) 10/30/2021   Lab Results  Component Value Date   WBC 5.9 10/30/2021    HGB 12.9 10/30/2021   HCT 38.6 10/30/2021   MCV 83 10/30/2021   PLT 282 10/30/2021    ASSESSMENT/PLAN:  Did she get flu shot last year?  Bivalent COVID booster?  pap A1c Shingrix? (Vs [place])  Discussed monthly self breast exams and yearly mammograms; at least 30 minutes of aerobic activity at least 5 days/week, weight-bearing exercise 2x/wk; proper sunscreen use reviewed; healthy diet, including goals of calcium and vitamin D intake and alcohol recommendations (less than or equal to 1 drink/day) reviewed; regular seatbelt use; changing batteries in smoke detectors.  Immunization recommendations discussed, UTD. Continue yearly flu shots. Shingrix at age 31. Colon cancer screening is UTD, repeat colonoscopy due 2028.   Consider 6 month med check--glu, D, TSH, poss A1c ENTER ORDERS IF WANTS PRIOR LABS  CPE 1 year

## 2021-11-04 ENCOUNTER — Encounter: Payer: Self-pay | Admitting: Family Medicine

## 2021-11-04 ENCOUNTER — Ambulatory Visit (INDEPENDENT_AMBULATORY_CARE_PROVIDER_SITE_OTHER): Payer: BC Managed Care – PPO | Admitting: Family Medicine

## 2021-11-04 ENCOUNTER — Telehealth: Payer: Self-pay

## 2021-11-04 VITALS — BP 150/100 | HR 80 | Ht 60.5 in | Wt 151.4 lb

## 2021-11-04 DIAGNOSIS — Z6829 Body mass index (BMI) 29.0-29.9, adult: Secondary | ICD-10-CM

## 2021-11-04 DIAGNOSIS — R030 Elevated blood-pressure reading, without diagnosis of hypertension: Secondary | ICD-10-CM

## 2021-11-04 DIAGNOSIS — E559 Vitamin D deficiency, unspecified: Secondary | ICD-10-CM

## 2021-11-04 DIAGNOSIS — G43009 Migraine without aura, not intractable, without status migrainosus: Secondary | ICD-10-CM

## 2021-11-04 DIAGNOSIS — E039 Hypothyroidism, unspecified: Secondary | ICD-10-CM | POA: Diagnosis not present

## 2021-11-04 DIAGNOSIS — E785 Hyperlipidemia, unspecified: Secondary | ICD-10-CM

## 2021-11-04 DIAGNOSIS — Z0000 Encounter for general adult medical examination without abnormal findings: Secondary | ICD-10-CM | POA: Diagnosis not present

## 2021-11-04 DIAGNOSIS — R7301 Impaired fasting glucose: Secondary | ICD-10-CM

## 2021-11-04 DIAGNOSIS — Z23 Encounter for immunization: Secondary | ICD-10-CM | POA: Diagnosis not present

## 2021-11-04 LAB — POCT GLYCOSYLATED HEMOGLOBIN (HGB A1C): Hemoglobin A1C: 5.8 % — AB (ref 4.0–5.6)

## 2021-11-04 NOTE — Telephone Encounter (Signed)
I scheduled the pt. At check out for a 3 month med check and labs a few days before that apt. If you could put the orders in for that.

## 2021-11-04 NOTE — Telephone Encounter (Signed)
Done. Looks like her physical in 10/2022 was scheduled with Dr. Redmond School instead of me.  Please change. Thanks.

## 2021-11-17 NOTE — Progress Notes (Unsigned)
  No chief complaint on file.   Patient presents for pelvic exam and pap smear. She was on her menstrual cycle at the time of her recent physical. She denies pelvic pain, abnormal vaginal discharge or pain with intercourse.  At her physical her BP was noted to be elevated. She was encouraged to limit the sodium in her diet, exercise regularly, and monitor her blood pressure. Her blood pressures have been running   BP Readings from Last 3 Encounters:  11/04/21 (!) 150/100  10/27/20 (!) 142/86  05/14/20 130/84   Vitamin D deficiency:  Level was low at 20.4.  She had only been taking 5000 IU tablet about 3-4x/week. She was encouraged to take it daily.  Hypothyroidism: has been subclinical, pt had declined treatment. TSH slightly higher, some fatigue and weight gain. She preferred to recheck in 3 mos before considering restarting thyroid meds. Lab Results  Component Value Date   TSH 6.140 (H) 10/30/2021    PMH, PSH, SH reviewed   ROS:  no fever, chills, headaches, dizziness, chest pain, shortness of breath.   Some fatigue. No change to hair/skin/nails/bowels. No vaginal discharge or urinary complaints.    PHYSICAL EXAM:  LMP 10/21/2020   Wt Readings from Last 3 Encounters:  11/04/21 151 lb 6.4 oz (68.7 kg)  10/27/20 144 lb 9.6 oz (65.6 kg)  09/12/20 143 lb (64.9 kg)   Well-appearing pleasant female in no distress    Genitalia:    External genitalia is normal without lesions. BUS/vagina normal, no abnormal discharge.  Uterus is normal in size, no adnexal masses or tenderness.  No cervical motion tenderness. No cervical lesions.  Pap performed  Rectal:    Normal sphincter tone, no masses.  Heme negative stool in vault    ASSESSMENT/PLAN:   Pap

## 2021-11-19 ENCOUNTER — Encounter: Payer: Self-pay | Admitting: Family Medicine

## 2021-11-19 ENCOUNTER — Ambulatory Visit (INDEPENDENT_AMBULATORY_CARE_PROVIDER_SITE_OTHER): Payer: BC Managed Care – PPO | Admitting: Family Medicine

## 2021-11-19 ENCOUNTER — Other Ambulatory Visit (HOSPITAL_COMMUNITY)
Admission: RE | Admit: 2021-11-19 | Discharge: 2021-11-19 | Disposition: A | Discharge status: Home / Self Care | Payer: BC Managed Care – PPO | Source: Ambulatory Visit | Attending: Family Medicine | Admitting: Family Medicine

## 2021-11-19 VITALS — BP 140/90 | HR 63 | Wt 151.6 lb

## 2021-11-19 DIAGNOSIS — R030 Elevated blood-pressure reading, without diagnosis of hypertension: Secondary | ICD-10-CM

## 2021-11-19 DIAGNOSIS — E039 Hypothyroidism, unspecified: Secondary | ICD-10-CM | POA: Diagnosis not present

## 2021-11-19 DIAGNOSIS — Z124 Encounter for screening for malignant neoplasm of cervix: Secondary | ICD-10-CM | POA: Insufficient documentation

## 2021-11-19 DIAGNOSIS — Z01419 Encounter for gynecological examination (general) (routine) without abnormal findings: Secondary | ICD-10-CM | POA: Diagnosis not present

## 2021-11-19 NOTE — Patient Instructions (Signed)
Monitor your blood pressure regularly. Omron is a good brand (arm preferred over wrist, if possible). Bring your monitor to your visit to verify the accuracy. You can always schedule a nurse visit to have it checked sooner.  Continue low sodium diet, regular exercise and working on weight loss, as these can improve your blood pressure.

## 2021-11-25 LAB — CYTOLOGY - PAP
Comment: NEGATIVE
Diagnosis: NEGATIVE
High risk HPV: NEGATIVE

## 2021-12-16 ENCOUNTER — Encounter: Payer: Self-pay | Admitting: Internal Medicine

## 2021-12-18 ENCOUNTER — Encounter: Payer: Self-pay | Admitting: Family Medicine

## 2022-01-19 ENCOUNTER — Encounter: Payer: Self-pay | Admitting: Internal Medicine

## 2022-01-22 ENCOUNTER — Other Ambulatory Visit: Payer: BC Managed Care – PPO

## 2022-01-28 ENCOUNTER — Encounter: Payer: BC Managed Care – PPO | Admitting: Family Medicine

## 2022-04-25 ENCOUNTER — Encounter: Payer: Self-pay | Admitting: Family Medicine

## 2022-04-26 ENCOUNTER — Encounter: Payer: Self-pay | Admitting: *Deleted

## 2022-09-07 ENCOUNTER — Encounter: Payer: Self-pay | Admitting: Family Medicine

## 2022-09-14 ENCOUNTER — Encounter: Payer: Self-pay | Admitting: *Deleted

## 2022-09-20 ENCOUNTER — Other Ambulatory Visit: Payer: Self-pay | Admitting: Family Medicine

## 2022-09-20 DIAGNOSIS — Z1231 Encounter for screening mammogram for malignant neoplasm of breast: Secondary | ICD-10-CM

## 2022-10-27 ENCOUNTER — Ambulatory Visit (INDEPENDENT_AMBULATORY_CARE_PROVIDER_SITE_OTHER): Payer: BC Managed Care – PPO | Admitting: Podiatry

## 2022-10-27 ENCOUNTER — Encounter: Payer: Self-pay | Admitting: Podiatry

## 2022-10-27 DIAGNOSIS — L600 Ingrowing nail: Secondary | ICD-10-CM

## 2022-10-27 MED ORDER — TRAMADOL HCL 50 MG PO TABS: 50.0000 mg | ORAL_TABLET | Freq: Four times a day (QID) | ORAL | 0 refills | Status: AC | PRN

## 2022-10-27 MED ORDER — AMOXICILLIN-POT CLAVULANATE 875-125 MG PO TABS: 1.0000 | ORAL_TABLET | Freq: Two times a day (BID) | ORAL | 0 refills | Status: DC

## 2022-10-27 NOTE — Patient Instructions (Signed)

## 2022-10-28 ENCOUNTER — Ambulatory Visit
Admission: RE | Admit: 2022-10-28 | Discharge: 2022-10-28 | Disposition: A | Discharge status: Home / Self Care | Payer: BC Managed Care – PPO | Source: Ambulatory Visit | Attending: Family Medicine | Admitting: Family Medicine

## 2022-10-28 DIAGNOSIS — Z1231 Encounter for screening mammogram for malignant neoplasm of breast: Secondary | ICD-10-CM

## 2022-11-03 ENCOUNTER — Other Ambulatory Visit: Payer: BC Managed Care – PPO

## 2022-11-03 ENCOUNTER — Other Ambulatory Visit: Payer: Self-pay | Admitting: Internal Medicine

## 2022-11-03 DIAGNOSIS — E559 Vitamin D deficiency, unspecified: Secondary | ICD-10-CM

## 2022-11-03 DIAGNOSIS — E785 Hyperlipidemia, unspecified: Secondary | ICD-10-CM

## 2022-11-03 DIAGNOSIS — E039 Hypothyroidism, unspecified: Secondary | ICD-10-CM

## 2022-11-03 DIAGNOSIS — R7301 Impaired fasting glucose: Secondary | ICD-10-CM

## 2022-11-04 LAB — LIPID PANEL
Cholesterol, Total: 216 mg/dL — ABNORMAL HIGH (ref 100–199)
HDL: 35 mg/dL — ABNORMAL LOW (ref >39)
LDL Chol Calc (NIH): 151 mg/dL — ABNORMAL HIGH (ref 0–99)
VLDL Cholesterol Cal: 30 mg/dL (ref 5–40)

## 2022-11-04 LAB — TSH: TSH: 6.09 u[IU]/mL — ABNORMAL HIGH (ref 0.450–4.500)

## 2022-11-04 LAB — GLUCOSE, RANDOM: Glucose: 121 mg/dL — ABNORMAL HIGH (ref 70–99)

## 2022-11-07 NOTE — Patient Instructions (Incomplete)
  HEALTH MAINTENANCE RECOMMENDATIONS:  It is recommended that you get at least 30 minutes of aerobic exercise at least 5 days/week (for weight loss, you may need as much as 60-90 minutes). This can be any activity that gets your heart rate up. This can be divided in 10-15 minute intervals if needed, but try and build up your endurance at least once a week.  Weight bearing exercise is also recommended twice weekly.  Eat a healthy diet with lots of vegetables, fruits and fiber.  "Colorful" foods have a lot of vitamins (ie green vegetables, tomatoes, red peppers, etc).  Limit sweet tea, regular sodas and alcoholic beverages, all of which has a lot of calories and sugar.  Up to 1 alcoholic drink daily may be beneficial for women (unless trying to lose weight, watch sugars).  Drink a lot of water.  Calcium recommendations are 1200-1500 mg daily (1500 mg for postmenopausal women or women without ovaries), and vitamin D 1000 IU daily.  This should be obtained from diet and/or supplements (vitamins), and calcium should not be taken all at once, but in divided doses.  Monthly self breast exams and yearly mammograms for women over the age of 46 is recommended.  Sunscreen of at least SPF 30 should be used on all sun-exposed parts of the skin when outside between the hours of 10 am and 4 pm (not just when at beach or pool, but even with exercise, golf, tennis, and yard work!)  Use a sunscreen that says "broad spectrum" so it covers both UVA and UVB rays, and make sure to reapply every 1-2 hours.  Remember to change the batteries in your smoke detectors when changing your clock times in the spring and fall. Carbon monoxide detectors are recommended for your home.  Use your seat belt every time you are in a car, and please drive safely and not be distracted with cell phones and texting while driving.  Remember to get your flu shot in the Fall (September/October). I recommend getting the updated COVID booster when  it becomes available in the Fall.  Continue to limit the sodium in your diet (read labels). Bring your blood pressure cuff to your next visit. Be sure that you are using the proper technique--look at the directions, but generally you should have your arm resting on your chest, with the monitor at heart height. Keep a lit of your blood pressure (with comments regarding diet, stress, if you were relaxed, which way you checked it). Bring the list to your visit.  Your cholesterol was much higher than last year. Your HDL remains low, which still affects the chol/HDL ratio.  Your triglycerides were higher (fat in the blood). Some of this could be related to eating out  more in the last 4 weeks. Your cooking is healthier than those from restaurants (not using ghee, coconut milk, etc). Continue to limit your portions of rice.  Brown rice is healthier. Shoot for more whole grains in your diet. Continue all of the beans and plant-based protein. Try and limit the paneer and cheese.  Your tests show that you are pre-diabetic--very close to diabetes this year (A1c of 6.4%, where as 6.5% is diabetes). Continue to limit your carbs/starches (pasta, breads, rice--choosing more whole grains) as well as sugar.

## 2022-11-07 NOTE — Progress Notes (Unsigned)
No chief complaint on file.  Megan Torres is a 52 y.o. female who presents for a complete physical. See below for labs done prior to her visit.  She has the following concerns:  Vitamin D deficiency:  Her last level was 20.4 in 10/2021 when taking 5000 IU, but only remembers it 3-4x/week. Recent level was lower at 16.6.  She has been taking  She has h/o hypothyroidism--had been off meds for many years (stopped 2011), previously monitored by Dr. Lucianne Muss. (Started back on meds during evaluation for infertility, for 6 months, stopped in 12/2015, and took it one other time for a bit when she was having more hair loss).  TSH has remained mildly elevated, but asymptomatic and she has declined treatment (subclinical hypothyroidism).  She has h/o +TPO antibodies.   She has mild fatigue, unchanged.  No changes to hair/skin/nails/bowels. Her feet sometimes still got hot.  She is better tolerant of being in the cold (previously couldn't tolerate cold temps).  Menses--irregular?? UPDATE  Component Ref Range & Units 4 d ago (11/03/22) 1 yr ago (10/30/21) 2 yr ago (10/22/20) 2 yr ago (05/14/20) 3 yr ago (10/24/19) 4 yr ago (10/12/18) 5 yr ago (10/06/17)  TSH 0.450 - 4.500 uIU/mL 6.090 High  6.140 High  5.080 High  2.870 4.050 6.030 High  5.800 High     Impaired fasting glucose:  She has had elevated fasting sugars (118 last year), A1c of 5.8% She tries to limit the sugar/sweets/carbs in her diet. She doesn't eat sweets.   She eats rice only once a week.  She eats fruit when she has sugar cravings, only occasional chocolate.  Migraines:  She previously had menstrual migraines, which resolved. She still occasionally gets a migraine, 1 or 2 in the last year.   Urgent care visit for migraine 12/2021. She is better at managing her triggers. She has imitrex to use prn, and it is effective.  Elevated BP's--noted at her physical last year, also up at her pap visit a few weeks later.  She was encouraged  to limit the sodium in her diet, exercise regularly, and monitor her blood pressure.   BP's elsewhere   Denies headaches, chest pain, dizziness.  BP Readings from Last 3 Encounters:  11/19/21 (!) 140/90  11/04/21 (!) 150/100  10/27/20 (!) 142/86      Diet: Doesn't eat meat.  Rare fritters, no other fried foods.  +cheese. Doesn't use ghee or coconut oil/milk. She cut down the sugar in her diet. Doesn't add salt to food, but adds salt when she cooks (though has cut back). +canned beans, rinses before use.  Immunization History  Administered Date(s) Administered   Influenza Split 02/01/2014   Influenza, Seasonal, Injecte, Preservative Fre 06/08/2012   Influenza,inj,Quad PF,6+ Mos 12/02/2016, 12/19/2019   Influenza-Unspecified 04/20/2022   PFIZER(Purple Top)SARS-COV-2 Vaccination 06/20/2019, 07/11/2019, 04/03/2020   Tdap 06/08/2012, 09/27/2016, 03/31/2017   Zoster Recombinant(Shingrix) 11/04/2021, 06/10/2022   MMR immune. Varicella previously found to be immune. Last Pap smear: 11/2021, normal, no high risk HPV present Last mammogram: 10/2022 Last colonoscopy: 12/2019, 1 tubular adenoma; 7 year f/u recommended Last DEXA: never Dentist: every 3 months  Ophtho: Exercise:   Last year: Walking 3-4 miles, 2-3 days/week currently. Just started back with hand weights last week. Just started yoga and pilates last week.   PMH, PSH, SH and FH were reviewed and updated    ROS:  The patient denies anorexia, fever, vision changes, decreased hearing, ear pain, sore throat, breast concerns, chest  pain, palpitations, dizziness, syncope, dyspnea on exertion, cough, swelling, nausea, vomiting, diarrhea, constipation, abdominal pain, melena, hematochezia, indigestion/heartburn, hematuria, incontinence, dysuria, irregular menstrual cycles, vaginal discharge, odor or itch, genital lesions, numbness, tingling, weakness, tremor, suspicious skin lesions, depression, anxiety, abnormal  bleeding/bruising, or enlarged lymph nodes.  Occasional knee swelling (bilaterally), uses aleve and backs off on the walking periodically. Migraines are infrequent Cycles  Denies hot flashes, night sweats. Some fatigue    PHYSICAL EXAM:  LMP 10/21/2020   Wt Readings from Last 3 Encounters:  11/19/21 151 lb 9.6 oz (68.8 kg)  11/04/21 151 lb 6.4 oz (68.7 kg)  10/27/20 144 lb 9.6 oz (65.6 kg)   General Appearance:     Alert, cooperative, no distress, appears stated age   Head:     Normocephalic, without obvious abnormality, atraumatic   Eyes:     PERRL, conjunctiva/corneas clear, EOM's intact, fundi benign   Ears:     Normal TM's and external ear canals   Nose:    Normal, no drainage or sinus tenderness  Throat:    Normal mucosa, no lesions  Neck:    Supple, no lymphadenopathy;  thyroid: borderline size, no  tenderness/nodules; no carotid bruit or JVD   Back:     Spine nontender, no curvature, ROM normal, no CVA tenderness   Lungs:      Clear to auscultation bilaterally without wheezes, rales or ronchi; respirations unlabored   Chest Wall:     No deformity, nontender   Heart:     Regular rate and rhythm, S1 and S2 normal, no murmur, rub or gallop   Breast Exam:    No nipple discharge, inversion, skin dimpling, breast masses or axillary lymphadenopathy.    Abdomen:      Soft, non-tender, nondistended, normoactive bowel sounds, no masses, no hepatosplenomegaly   Genitalia:    External genitalia is normal without lesions. BUS/vagina normal, no abnormal discharge.  Uterus is normal in size, no adnexal masses or tenderness.  No cervical motion tenderness. Pap not performed  Rectal:    Normal sphincter tone, no masses.  Heme negative stool in vault  Extremities:    No clubbing, cyanosis or edema.   Pulses:    2+ and symmetric all extremities   Skin:    Skin color, texture, turgor normal, no rashes or lesions   Lymph nodes:    Cervical, supraclavicular and axillary nodes normal    Neurologic:    Normal strength, sensation and gait; reflexes 2+ and symmetric throughout                     Psych:   Normal mood, affect, hygiene and grooming   Lab Results  Component Value Date   HGBA1C 5.8 (A) 11/04/2021   Fasting glucose 121  Vitamin D-OH 16.6  Lab Results  Component Value Date   TSH 6.090 (H) 11/03/2022   Lab Results  Component Value Date   CHOL 216 (H) 11/03/2022   HDL 35 (L) 11/03/2022   LDLCALC 151 (H) 11/03/2022   TRIG 162 (H) 11/03/2022   CHOLHDL 6.2 (H) 11/03/2022    ASSESSMENT/PLAN:  A1c  Did she get BP monitor as rec 11/2021?  Monitoring BP's elsewhere?   Hyperlipidemia Elevated TSH, subclinical?  BPs???  Discuss coronary CT vs meds  Rx vitamin D   Discussed monthly self breast exams and yearly mammograms; at least 30 minutes of aerobic activity at least 5 days/week, weight-bearing exercise 2x/wk; proper sunscreen use reviewed; healthy diet, including  goals of calcium and vitamin D intake and alcohol recommendations (less than or equal to 1 drink/day) reviewed; regular seatbelt use; changing batteries in smoke detectors.  Immunization recommendations discussed, UTD. Continue yearly flu shots (to get earlier--got in 04/2022, but had influenza A in 03/2022).  Bivalent COVID booster recommended in the Fall. Colon cancer screening is UTD, repeat colonoscopy due 2028.

## 2022-11-08 ENCOUNTER — Encounter: Payer: Self-pay | Admitting: Family Medicine

## 2022-11-08 ENCOUNTER — Ambulatory Visit: Payer: BC Managed Care – PPO | Admitting: Family Medicine

## 2022-11-08 ENCOUNTER — Encounter: Payer: BC Managed Care – PPO | Admitting: Family Medicine

## 2022-11-08 VITALS — BP 136/80 | HR 80 | Ht 60.0 in | Wt 148.4 lb

## 2022-11-08 DIAGNOSIS — Z6828 Body mass index (BMI) 28.0-28.9, adult: Secondary | ICD-10-CM

## 2022-11-08 DIAGNOSIS — Z0000 Encounter for general adult medical examination without abnormal findings: Secondary | ICD-10-CM

## 2022-11-08 DIAGNOSIS — R7301 Impaired fasting glucose: Secondary | ICD-10-CM

## 2022-11-08 DIAGNOSIS — E039 Hypothyroidism, unspecified: Secondary | ICD-10-CM

## 2022-11-08 DIAGNOSIS — E782 Mixed hyperlipidemia: Secondary | ICD-10-CM | POA: Diagnosis not present

## 2022-11-08 DIAGNOSIS — E559 Vitamin D deficiency, unspecified: Secondary | ICD-10-CM | POA: Diagnosis not present

## 2022-11-08 DIAGNOSIS — E785 Hyperlipidemia, unspecified: Secondary | ICD-10-CM

## 2022-11-08 LAB — POCT URINALYSIS DIP (PROADVANTAGE DEVICE)
Bilirubin, UA: NEGATIVE
Glucose, UA: NEGATIVE mg/dL
Ketones, POC UA: NEGATIVE mg/dL
Nitrite, UA: NEGATIVE
Protein Ur, POC: NEGATIVE mg/dL
Specific Gravity, Urine: 1.02
Urobilinogen, Ur: 0.2
pH, UA: 6 (ref 5.0–8.0)

## 2022-11-08 LAB — POCT GLYCOSYLATED HEMOGLOBIN (HGB A1C): Hemoglobin A1C: 6.4 % — AB (ref 4.0–5.6)

## 2022-11-08 MED ORDER — VITAMIN D (ERGOCALCIFEROL) 1.25 MG (50000 UNIT) PO CAPS: 50000.0000 [IU] | ORAL_CAPSULE | ORAL | 0 refills | Status: DC

## 2022-11-08 NOTE — Assessment & Plan Note (Signed)
TG higher, HDL is low, LDL is higher, and chol/HDL ratio is very high.  Reviewed diet in detail today--has been different than her usual diet.  She would like trial of diet for the next 3 months. If lipids remain elevated, we will need to discuss meds vs coronary calcium scan. We did discuss calcium score today, but will hold off on decision for scan until f/u visit. Counseled in detail re: diet

## 2022-11-08 NOTE — Progress Notes (Signed)
   Chief Complaint  Patient presents with   Nail Problem    "My big toenails hurt." N - toenails hurt L - hallux bilateral D - 2 mos. O - gradually worse C - curling in, sharp pain A - hit it, closed toe shoes T - none   Foot Pain    "My feet burn." N - burning L - bilateral dorsal and plantar D - 2+ years O - on and off C - hot feeling, burning A - none T - deep feet in Epsom Salt cold water, cold compression    Subjective: Patient presents today for evaluation of pain to the bilateral great toes. Patient is concerned for possible ingrown nail.  It is very sensitive to touch.  Patient presents today for further treatment and evaluation.  Patient also has been experiencing some numbness and tingling to bilateral feet.  Intermittent.  Ongoing for few years now.  Minimally symptomatic.  Past Medical History:  Diagnosis Date   Allergy    Family history of breast cancer in mother    Family history of breast cancer in mother    she was never tested for BRCA   Family history of ovarian cancer    Thyroid disease    HYPOTHYROID, h/o hyperthyroidism.  off meds since 8/09  (Dr. Lucianne Muss)   Vitamin D deficiency 2010    Past Surgical History:  Procedure Laterality Date   LIPOMA EXCISION  1/08   fatty tumor removed from L axilla (in [place])    No Known Allergies  Objective:  General: Well developed, nourished, in no acute distress, alert and oriented x3   Dermatology: Skin is warm, dry and supple bilateral.  Bilateral great toes is tender with evidence of an ingrowing nail. Pain on palpation noted to the border of the nail fold. The remaining nails appear unremarkable at this time.   Vascular: DP and PT pulses palpable.  No clinical evidence of vascular compromise  Neruologic: Grossly intact via light touch bilateral.  Musculoskeletal: No pedal deformity noted  Assesement: #1 Paronychia with ingrowing nail bilateral great toes  Plan of Care:  -Patient evaluated.   -Discussed treatment alternatives and plan of care. Explained nail avulsion procedure and post procedure course to patient. -Patient opted for permanent partial nail avulsion of the ingrown portion of the nail.  -Prior to procedure, local anesthesia infiltration utilized using 3 ml of a 50:50 mixture of 2% plain lidocaine and 0.5% plain marcaine in a normal hallux block fashion and a betadine prep performed.  -Partial permanent nail avulsion with chemical matrixectomy performed using 3x30sec applications of phenol followed by alcohol flush.  -Light dressing applied.  Post care instructions provided -Return to clinic 3 weeks  Felecia Shelling, DPM [facility]  Dr. Felecia Shelling, DPM    [address] [place], [place] [address]                Office [phone]  Fax [phone]

## 2022-11-08 NOTE — Assessment & Plan Note (Signed)
Fasting glucose and A1c much higher than last year, closer to diabetes.  Diet/exercise has been different than her normal routine for the last 4 weeks due to pain from ingrown toenails and surgery.  Prefers trial of diet/exercise for 3 months.  Will refer to dietician.  Follow-up in 3 months. Discussed limiting sugar, carbs, using brown rice rather than white, daily exercise, weight loss.

## 2022-11-08 NOTE — Assessment & Plan Note (Signed)
Subclinical hypothyroidism. Will continue to hold off on treatment, given lack of symptoms, and TSH stable in 5-6 range.  Aware of symptoms of hypothyroidism, and will contact us if symptoms develop

## 2022-11-08 NOTE — Assessment & Plan Note (Signed)
Inadequately replaced with current dose of 5000 IU 3-4x/week. Will treat with another 12 weeks of prescription, and then to take 5000 IU daily, long-term. Discussed use of pill box to help with compliance.

## 2022-11-17 ENCOUNTER — Ambulatory Visit: Payer: BC Managed Care – PPO | Admitting: Podiatry

## 2022-11-17 DIAGNOSIS — L600 Ingrowing nail: Secondary | ICD-10-CM | POA: Diagnosis not present

## 2022-11-17 NOTE — Progress Notes (Signed)
   Chief Complaint  Patient presents with   Toe Pain    Follow up from ingrown toenail, still a little sore. No drainage. The left is taking longer     Subjective: 52 y.o. female presents today status post permanent nail avulsion procedure of the medial and lateral borders of the bilateral great toes that was performed on 10/27/2022. Patient doing well. She soaked her feet and has been applying antibiotic ointment as instructed.    Past Medical History:  Diagnosis Date   Allergy    Family history of breast cancer in mother    Family history of breast cancer in mother    she was never tested for BRCA   Family history of ovarian cancer    Thyroid disease    HYPOTHYROID, h/o hyperthyroidism.  off meds since 8/09  (Dr. Lucianne Muss)   Vitamin D deficiency 2010    Objective: Neurovascular status intact.  Skin is warm, dry and supple. Nail and respective nail fold appears to be healing appropriately.   Assessment: #1 s/p partial permanent nail matrixectomy MED + LAT border B/L great toes   Plan of care: #1 patient was evaluated  #2 light debridement of the periungual debris was performed to the border of the respective toe and nail plate using a tissue nipper. #3 patient is to return to clinic on a PRN basis.   Felecia Shelling, DPM [facility]  Dr. Felecia Shelling, DPM    [address] [place], [place] [address]                Office [phone]  Fax [phone]

## 2023-02-07 ENCOUNTER — Other Ambulatory Visit: Payer: BC Managed Care – PPO

## 2023-02-07 DIAGNOSIS — E559 Vitamin D deficiency, unspecified: Secondary | ICD-10-CM

## 2023-02-07 DIAGNOSIS — R7301 Impaired fasting glucose: Secondary | ICD-10-CM

## 2023-02-07 DIAGNOSIS — E782 Mixed hyperlipidemia: Secondary | ICD-10-CM

## 2023-02-08 LAB — LIPID PANEL
Chol/HDL Ratio: 4.9 ratio — ABNORMAL HIGH (ref 0.0–4.4)
Cholesterol, Total: 193 mg/dL (ref 100–199)
HDL: 39 mg/dL — ABNORMAL LOW (ref >39)
LDL Chol Calc (NIH): 133 mg/dL — ABNORMAL HIGH (ref 0–99)
Triglycerides: 118 mg/dL (ref 0–149)
VLDL Cholesterol Cal: 21 mg/dL (ref 5–40)

## 2023-02-08 LAB — VITAMIN D 25 HYDROXY (VIT D DEFICIENCY, FRACTURES): Vit D, 25-Hydroxy: 40.5 ng/mL (ref 30.0–100.0)

## 2023-02-08 LAB — GLUCOSE, RANDOM: Glucose: 125 mg/dL — ABNORMAL HIGH (ref 70–99)

## 2023-02-09 NOTE — Progress Notes (Unsigned)
No chief complaint on file.  Patient presents for 3 month follow-up on multiple problems--prediabetes, hyperlipidemia, vitamin D deficiency  Vitamin D deficiency:  Her last level was 16.6 in 10/2022.  She had been taking 5000 IU, but only remembering it 3-4x/week. We treated her with another 12 weeks of prescription, to be followed by 5000 IU daily, long-term.  Currently taking  Component Ref Range & Units 2 d ago (02/07/23) 3 mo ago (11/03/22) 1 yr ago (10/30/21) 2 yr ago (10/22/20) 2 yr ago (05/14/20) 3 yr ago (10/24/19) 4 yr ago (10/12/18)  Vit D, 25-Hydroxy 30.0 - 100.0 ng/mL 40.5 16.6 Low  CM 20.4 Low  CM 18.7 Low  CM 20.7 Low  CM 20.6 Low  CM 16.0 Low      Impaired fasting glucose:  A1c was up to 6.4% on last check in July.  Fasting sugar was 121 at that time. She tries to limit the sugar/sweets/carbs in her diet. She doesn't eat sweets.    At last visit she reported the following diet-- She eats white rice 1-2x/week, 1 cup (up to 3x/week).   She eats fruit when she has sugar cravings, only occasional chocolate. Uses 1.5 teaspoons of sugar in her coffee (just 1 cup/d).   Has pasta once a month.  Had some pastry puffs this weekend. She hadn't cooked for the last 4 weeks--takeout, restaurants (some japanese, with rice, some fried items). Has taken in a lot of [place] food. She tends to cook it healthier--no ghee, no coconut milk or oil.  Today she reports having made the following changes   Lab Results  Component Value Date   HGBA1C 6.4 (A) 11/08/2022      Dyslipidemia:  At her last visit, she had worse/different diet than her usual (eating out more, takeout [place] rather than homecooked).  UPDATE DIET*** She eats 4-6 egg whites/week, no yolks. Has cheese at work (2-3x/week)--cubes and slices. Also has some paneer (cheese) No creamy sauces/dressings/soups. Has been having a lot of indian yogurt diluted with water. Uses half and half in her coffee. Almond milk in  cereal. Vegetarian--fish maybe a few times/year (at most). Eats a lot of lentils, and some beans (blackeyed peas, pinto beans, chick peas, etc).   Component Ref Range & Units 2 d ago 3 mo ago 1 yr ago 2 yr ago 3 yr ago 4 yr ago 6 yr ago  Cholesterol, Total 100 - 199 mg/dL 161 096 High  045 409 811 172   Triglycerides 0 - 149 mg/dL 914 782 High  956 213 086 92 116 R  HDL >39 mg/dL 39 Low  35 Low  32 Low  35 Low  45 42 39 Low  R  VLDL Cholesterol Cal 5 - 40 mg/dL 21 30 25 26 19 18    LDL Chol Calc (NIH) 0 - 99 mg/dL 578 High  469 High  629 High  111 High  124 High     Chol/HDL Ratio 0.0 - 4.4 ratio 4.9 High  6.2 High  CM 5.6 High  CM 4.9 High  CM 4.2 CM 4.1 CM       PMH, PSH, SH reviewed    ROS:    PHYSICAL EXAM:  There were no vitals taken for this visit.  Wt Readings from Last 3 Encounters:  11/08/22 148 lb 6.4 oz (67.3 kg)  11/19/21 151 lb 9.6 oz (68.8 kg)  11/04/21 151 lb 6.4 oz (68.7 kg)     Fasting glucose 125  Lab Results  Component Value Date   CHOL 193 02/07/2023   HDL 39 (L) 02/07/2023   LDLCALC 133 (H) 02/07/2023   TRIG 118 02/07/2023   CHOLHDL 4.9 (H) 02/07/2023   Vitamin D-OH 40.5      ASSESSMENT/PLAN:  Flu shot Offer/give/decline COVID booster  Please check on cost to pt for POC A1c today.  Her insurance likely won't cover it. Her fasting sugar was still high, unsure if pt would want to repeat A1c today (and pay OOP). If higher than last time, is diabetes.  Did she finish D rx yet? Taking 5000 U daily??  Adequately replaced, but question will be whether she can be compliant with daily supplement.  LDL improved, ratio is still high. Low threshold for starting statin. Will be mandatory of A1c 6.5 or higher.  Add'l 3 mos trial?? Pay OOP for A1c today (if not covered by insurance)??

## 2023-02-10 ENCOUNTER — Encounter: Payer: Self-pay | Admitting: Family Medicine

## 2023-02-10 ENCOUNTER — Ambulatory Visit: Payer: BC Managed Care – PPO | Admitting: Family Medicine

## 2023-02-10 VITALS — BP 148/88 | HR 80 | Ht 60.0 in | Wt 151.8 lb

## 2023-02-10 DIAGNOSIS — R7301 Impaired fasting glucose: Secondary | ICD-10-CM

## 2023-02-10 DIAGNOSIS — Z6829 Body mass index (BMI) 29.0-29.9, adult: Secondary | ICD-10-CM

## 2023-02-10 DIAGNOSIS — E039 Hypothyroidism, unspecified: Secondary | ICD-10-CM | POA: Diagnosis not present

## 2023-02-10 DIAGNOSIS — E782 Mixed hyperlipidemia: Secondary | ICD-10-CM

## 2023-02-10 DIAGNOSIS — R030 Elevated blood-pressure reading, without diagnosis of hypertension: Secondary | ICD-10-CM

## 2023-02-10 DIAGNOSIS — Z5181 Encounter for therapeutic drug level monitoring: Secondary | ICD-10-CM

## 2023-02-10 DIAGNOSIS — E559 Vitamin D deficiency, unspecified: Secondary | ICD-10-CM | POA: Diagnosis not present

## 2023-02-10 LAB — POCT GLYCOSYLATED HEMOGLOBIN (HGB A1C): Hemoglobin A1C: 5.9 % — AB (ref 4.0–5.6)

## 2023-02-10 NOTE — Patient Instructions (Addendum)
Please start taking vitamin D3 5000 IU daily, and continue it long-term.  Taking it just 3-4x/week wasn't enough to maintain levels, so work hard to remember and take it every day.  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 Your A1c and lipids have improved. We definitely have some more work to do, but can wait and see what the dietician through your job can offer.  If you have persistent fatigue and hair loss (and doesn't improve with vacation and decreased stress), we can consider checking your thyroid again and possibly starting low dose Synthroid.  Your blood pressure was very high at first, still above goal on repeat.  Continue to limit sodium in your diet. Continue daily exercise and working on weight loss.  Continue to monitor your blood pressure, and bring in your list of blood pressure with you to your next visit.  If it stays >140/90, we will have to start medication. Ideally we want to see your blood pressure <130/80.

## 2023-05-05 ENCOUNTER — Encounter: Payer: Self-pay | Admitting: Family Medicine

## 2023-05-05 ENCOUNTER — Ambulatory Visit: Payer: BC Managed Care – PPO | Admitting: Family Medicine

## 2023-05-05 VITALS — BP 140/92 | HR 71

## 2023-05-05 DIAGNOSIS — L659 Nonscarring hair loss, unspecified: Secondary | ICD-10-CM

## 2023-05-05 DIAGNOSIS — G43009 Migraine without aura, not intractable, without status migrainosus: Secondary | ICD-10-CM

## 2023-05-05 DIAGNOSIS — N951 Menopausal and female climacteric states: Secondary | ICD-10-CM | POA: Diagnosis not present

## 2023-05-05 DIAGNOSIS — M542 Cervicalgia: Secondary | ICD-10-CM | POA: Diagnosis not present

## 2023-05-05 MED ORDER — UBRELVY 50 MG PO TABS: 1.0000 | ORAL_TABLET | Freq: Every day | ORAL | Status: DC | PRN

## 2023-05-05 MED ORDER — NURTEC 75 MG PO TBDP: 1.0000 | ORAL_TABLET | Freq: Every day | ORAL | Status: DC | PRN

## 2023-05-05 MED ORDER — UBRELVY 100 MG PO TABS: 1.0000 | ORAL_TABLET | Freq: Every day | ORAL | 0 refills | Status: DC | PRN

## 2023-05-05 MED ORDER — MELOXICAM 15 MG PO TABS: 15.0000 mg | ORAL_TABLET | Freq: Every day | ORAL | 0 refills | Status: DC

## 2023-05-05 NOTE — Patient Instructions (Signed)
Consider seeing a gynecologist (Dr. Vincente Poli) if you are having more menopausal symptoms that would like to consider treatment for (including irregular cycles, hair loss, etc). Your migraines might preclude you from being able to take some of the estrogen therapies. Consider a consultation.  Migraines--cycles seem to be a trigger. Your neck pain likely is also a trigger. Please keep a headache diary/journal/calendar.  Document headache days, severity (7/10, ex), what you took, and any possible triggers (neck pain, sinus, cycle, stress, foods, etc).  Let's try Nurtec or Ubrelvy for your acute migraines to see if this is more effective in treating the migraine than the triptan. Nurtec is just once, at onset of the migraine. The Bernita Raisin (not taken the same day, but for a different migraine) can be taken once, and repeat 2 hours later if not better. (You can take up to 200 mg of ubrelvy in a 24 hour period--1-2 tablets of 50 mg; they make a 100 mg, but we don't have samples).  We will refer you to neurology for migraines, but this may take a while. The headache calendar will be very useful for the neurology visit.  Treat your neck pain with ice/heat/stretches. We will prescribe another course of meloxicam (anti-inflammatory that helped in the past). Be sure to take this with food. Do not take any ibuprofen, aleve, or other anti-inflammatories while taking the meloxicam. You can use tylenol, and topical medications (biofreeze, salonpas patches with lidocaine, etc).

## 2023-05-05 NOTE — Progress Notes (Signed)
Chief Complaint  Patient presents with   Consult   She is requesting a referral to a migraine specialist. She used to get a migraine every 3-4 months, and has now had 4 migraines in the last 2 months.  One was after her cycle (in December), had another migraine this month related to her cycle. Cycles are somewhat irregular.  Didn't have a cycle in November, had been fairly regular prior to that (more irregular since age 53). Has noticed that she has been more emotional (sad/irritable) with her cycles.  1/18 she had a migraine that lasted 3 days. She took sumatriptan in the morning and the evening x 2 days (Sat/Sun), and just morning dose on Monday. She had some nausea on Sat/Sun,and zofran worked well, no vomiting. Headache improved, went to work on Tuesday.  Yesterday she "could barely move"--she woke up with pain at the top of her head, and also in her neck. This is the same as her other headache/migraine. +light sensitivity.  She doesn't get aura with her migraines. She has been icing her neck. Currently having pain in her R shoulder, denies migraine/headache.  Denies stress, or other known triggers. Having neck pain since last Tues/Wednesday--worse after weight-lifting at the gym, and then her period started. She has been aware of migraines around cycles in the past.  She has h/o neck pain--last saw PT (Aart Schulenklopper) 2 years ago, and was also treated with NSAID, which helped. Per chart, was treated with mobic last in 09/2020.  Hair loss: She has previously mentioned this (at October visit). She is concerned today, as she noticed "clumps"--noticing a lot more hair loss over the last week. Mainly notices it after washing her hair. Denies any patchy hair loss, or thinning of her hair.  She has been letting her hair grow. She has known hypothyroidism, mild/subclinical, and has elected not to treat it. Lab Results  Component Value Date   TSH 6.090 (H) 11/03/2022    Elevated blood  pressures: She hasn't checked BP's over the last month. She tries to limit her sodium.  She is hoping to see dietician soon through work. BP Readings from Last 3 Encounters:  05/05/23 (!) 140/92  02/10/23 (!) 148/88  11/08/22 136/80    Vitamin D deficiency.  Last level was normal, after Rx. She is taking 4000 IU daily. Due to be rechecked next month.  Mixed hyperlipidemia.  Last check showed improvement, but still borderline. She wasn't able to get a visit with dietician.  Plans to call now to get in with dietician through her job.  She is scheduled for labs on 2/24, and visit on 2/27.    PMH, PSH, SH reviewed  Outpatient Encounter Medications as of 05/05/2023  Medication Sig Note   Rimegepant Sulfate (NURTEC) 75 MG TBDP Take 1 tablet (75 mg total) by mouth daily as needed (migraine).    SUMAtriptan (IMITREX) 25 MG tablet Take 1 tablet (25 mg total) by mouth daily. 02/10/2023: As needed   Ubrogepant (UBRELVY) 100 MG TABS Take 1 tablet (100 mg total) by mouth daily as needed (migraine). May repeat the dose once in 2 hours, if needed (max 200 mg/24 hours)    Ubrogepant (UBRELVY) 50 MG TABS Take 1-2 tablets (50-100 mg total) by mouth daily as needed (migraine). You may repeat the dose in 2 hours if needed (max 200 mg/24 hour period)    vitamin B-12 (CYANOCOBALAMIN) 500 MCG tablet Take 500 mcg by mouth daily.    VITAMIN D PO Take  5,000 Units by mouth.    meloxicam (MOBIC) 15 MG tablet Take 1 tablet (15 mg total) by mouth daily. Take with food, daily, until your pain has resolved.    No facility-administered encounter medications on file as of 05/05/2023.   NOT taking meloxicam prior to today's visit  No Known Allergies  ROS: no fever, chills, URI symptoms, allergies, sinus pain. +migraines and neck pain per HPI. No chest pain, shortness of breath. +hair loss. No weight changes, skin/nail changes. Moods remain good. No numbness/tingling/weakness. See HPI    PHYSICAL EXAM:  BP  (!) 140/92   Pulse 71   LMP 04/29/2023 (Exact Date)   SpO2 98%   Wt Readings from Last 3 Encounters:  02/10/23 151 lb 12.8 oz (68.9 kg)  11/08/22 148 lb 6.4 oz (67.3 kg)  11/19/21 151 lb 9.6 oz (68.8 kg)   Pleasant, well-appearing female, in no distress HEENT: conjunctiva and sclera are clear, EOMI, fundi benign. Scalp--normal, no flaking, inflammation, or alopecia. OP clear. Sinuses nontender Neck: Tender at R trapezius muscle, and also somewhat more anterior to this in the neck. No muscle spasm noted. Nontender at [facility]. Mildly tender diffusely over C-spine No lymphadenopathy or mass Heart: regular rate and rhythm Lungs; clear bilaterally Back: no spinal or CVA tenderness Abdomen: soft, nontender, no mass Extremities: no edema Neuro: alert and oriented, cranial nerves intact. Normal strength, sensation. DTR's symmetric. Normal gait. Psych: normal mood, affect, hygiene and grooming    ASSESSMENT/PLAN:  Neck pain - muscular, may be a trigger for migraines. Heat/massage/posture, meloxicam. Consider PT again - Plan: meloxicam (MOBIC) 15 MG tablet  Migraine without aura and without status migrainosus, not intractable - Samples of Nurtec and Ubrelvy given, with instruction, can use prn with next migraine (might shorten course). To keep headache journal - Plan: Rimegepant Sulfate (NURTEC) 75 MG TBDP, Ubrogepant (UBRELVY) 50 MG TABS, Ubrogepant (UBRELVY) 100 MG TABS, Ambulatory referral to Neurology  Hair loss - ddx reviewed--likely contributing factors are perimenopause and hypothyroidism. No thinning or alopecia. Reassured.  Perimenopausal - migraines seem to have a hormonal component (not new). Hair loss can also be related to hormone changes, vs thyroid  I spent 50 minutes dedicated to the care of this patient, including pre-visit review of records, face to face time, post-visit ordering of testing and documentation.   Hair loss--normal, menopause/peri, related to subclinical  hypothyroid  Consider seeing a gynecologist (Dr. Vincente Poli) if you are having more menopausal symptoms that would like to consider treatment for (including irregular cycles, hair loss, etc). Your migraines might preclude you from being able to take some of the estrogen therapies. Consider a consultation.  Migraines--cycles seem to be a trigger. Your neck pain likely is also a trigger. Please keep a headache diary/journal/calendar.  Document headache days, severity (7/10, ex), what you took, and any possible triggers (neck pain, sinus, cycle, stress, foods, etc).  Let's try Nurtec or Ubrelvy for your acute migraines to see if this is more effective in treating the migraine than the triptan. Nurtec is just once, at onset of the migraine. The Bernita Raisin (not taken the same day, but for a different migraine) can be taken once, and repeat 2 hours later if not better. (You can take up to 200 mg of ubrelvy in a 24 hour period--1-2 tablets of 50 mg; they make a 100 mg, but we don't have samples).  We will refer you to neurology for migraines, but this may take a while. The headache calendar will be very useful  for the neurology visit.  Treat your neck pain with ice/heat/stretches. We will prescribe another course of meloxicam (anti-inflammatory that helped in the past). Be sure to take this with food. Do not take any ibuprofen, aleve, or other anti-inflammatories while taking the meloxicam. You can use tylenol, and topical medications (biofreeze, salonpas patches with lidocaine, etc).

## 2023-05-10 ENCOUNTER — Telehealth: Payer: Self-pay

## 2023-05-10 ENCOUNTER — Other Ambulatory Visit (HOSPITAL_COMMUNITY): Payer: Self-pay

## 2023-05-10 NOTE — Telephone Encounter (Signed)
Pharmacy Patient Advocate Encounter   Received notification from CoverMyMeds that prior authorization for Ubrelvy 100MG  is required/requested.   Insurance verification completed.   The patient is insured through [employer] .   Per test claim: PA required; PA submitted to above mentioned insurance via CoverMyMeds Key/confirmation #/EOC Key: [id number]   Status is pending

## 2023-05-11 ENCOUNTER — Other Ambulatory Visit (HOSPITAL_COMMUNITY): Payer: Self-pay

## 2023-05-11 NOTE — Telephone Encounter (Signed)
Pharmacy Patient Advocate Encounter  Received notification from [facility] that Prior Authorization for Ubrelvy 100MG  tablets has been APPROVED from 12.29.24 to 1.28.26. Ran test claim, Copay is $0.00. This test claim was processed through [facility]- copay amounts may vary at other pharmacies due to pharmacy/plan contracts, or as the patient moves through the different stages of their insurance plan.   PA #/Case ID/Reference #: Key: [id number]

## 2023-06-06 ENCOUNTER — Other Ambulatory Visit: Payer: BC Managed Care – PPO

## 2023-06-09 ENCOUNTER — Encounter: Payer: BC Managed Care – PPO | Admitting: Family Medicine

## 2023-06-30 ENCOUNTER — Other Ambulatory Visit: Payer: BC Managed Care – PPO

## 2023-07-06 ENCOUNTER — Encounter: Payer: BC Managed Care – PPO | Admitting: Family Medicine

## 2023-08-12 ENCOUNTER — Other Ambulatory Visit: Payer: Self-pay | Admitting: Family Medicine

## 2023-08-12 DIAGNOSIS — M542 Cervicalgia: Secondary | ICD-10-CM

## 2023-08-12 DIAGNOSIS — G43009 Migraine without aura, not intractable, without status migrainosus: Secondary | ICD-10-CM

## 2023-08-12 MED ORDER — MELOXICAM 15 MG PO TABS: 15.0000 mg | ORAL_TABLET | Freq: Every day | ORAL | 0 refills | Status: AC

## 2023-08-12 MED ORDER — UBRELVY 100 MG PO TABS: 1.0000 | ORAL_TABLET | Freq: Every day | ORAL | 0 refills | Status: DC | PRN

## 2023-08-12 NOTE — Telephone Encounter (Signed)
 Re: Ubrelvy --she was given samples of both Nurtec and Ubrelvy  at visit in January, and referred to neurology for her migraines. The quantity #1 was bc it was a sample given. She has an appointment with neuro on 5/13.  She should probably wait until her visit, but you can give her another sample if needed before then.  Vitamin B12 is OTC, never prescribed by me..  Meloxicam  was last rx'd in January, not intended for ongoing use or refill without eval or knowing what/why.

## 2023-08-12 NOTE — Telephone Encounter (Signed)
 Sending both in for #15

## 2023-08-12 NOTE — Telephone Encounter (Signed)
 Spoke with patient and she would like to have Ubrevly on hand incase she gets a migraine. She can not come in for a sample but can pick one up at pharmacy if ok to send in.  Vitamin b12 she will get OTC  Meloxicam  she lost bottle 4 weeks ago in the hotel in [place] and she likes to keep it on hand incase she has severe body pain/ knee or feet pain. She uses this as needed.

## 2023-08-23 ENCOUNTER — Encounter: Payer: Self-pay | Admitting: Neurology

## 2023-08-23 ENCOUNTER — Ambulatory Visit (INDEPENDENT_AMBULATORY_CARE_PROVIDER_SITE_OTHER): Payer: BC Managed Care – PPO | Admitting: Neurology

## 2023-08-23 VITALS — BP 170/96 | HR 70 | Ht 64.0 in | Wt 151.8 lb

## 2023-08-23 DIAGNOSIS — G43001 Migraine without aura, not intractable, with status migrainosus: Secondary | ICD-10-CM

## 2023-08-23 MED ORDER — ONDANSETRON 8 MG PO TBDP: 8.0000 mg | ORAL_TABLET | Freq: Three times a day (TID) | ORAL | 11 refills | Status: AC | PRN

## 2023-08-23 MED ORDER — PROPRANOLOL HCL 20 MG PO TABS: 20.0000 mg | ORAL_TABLET | Freq: Two times a day (BID) | ORAL | 6 refills | Status: DC

## 2023-08-23 NOTE — Patient Instructions (Addendum)
 Prevention: Propranolol or Topiramate are traditional medications. May want to consider Propranolol. Other options include Ajovy, Emgality, Qulipta and these are new migraine preventatives also botox for migraines  Acute: Ubrelvy  at onset can take it ondansetron   Cervical myofascial pain: Mobic (Meloxicam ) at bedtime. Can also try muscle relaxers in the future  Light: Fl41 tinting(theraspecs), Car tinting, green light therapy for migraines  Propranolol Tablets What is this medication? PROPRANOLOL (proe PRAN oh lole) treats many conditions such as high blood pressure, tremors, and a type of arrhythmia known as AFib (atrial fibrillation). It works by lowering your blood pressure and heart rate, making it easier for your heart to pump blood to the rest of your body. It may be used to prevent migraine headaches. It works by relaxing the blood vessels in the brain that cause migraines. It belongs to a group of medications called beta blockers. This medicine may be used for other purposes; ask your health care provider or pharmacist if you have questions. COMMON BRAND NAME(S): Inderal What should I tell my care team before I take this medication? They need to know if you have any of these conditions: Diabetes Having surgery Heart or blood vessel conditions, such as slow heartbeat, heart failure, heart block Kidney disease Liver disease Lung or breathing disease, such as asthma or COPD Myasthenia gravis Pheochromocytoma Thyroid  disease An unusual or allergic reaction to propranolol, other medications, foods, dyes, or preservatives Pregnant or trying to get pregnant Breastfeeding How should I use this medication? Take this medication by mouth. Take it as directed on the prescription label at the same time every day. Keep taking it unless your care team tells you to stop. Talk to your care team about the use of this medication in children. Special care may be needed. Overdosage: If you think you  have taken too much of this medicine contact a poison control center or emergency room at once. NOTE: This medicine is only for you. Do not share this medicine with others. What if I miss a dose? If you miss a dose, take it as soon as you can. If it is almost time for your next dose, take only that dose. Do not take double or extra doses. What may interact with this medication? Do not take this medication with any of the following: Thioridazine This medication may also interact with the following: Certain medications for blood pressure, heart disease, irregular heartbeat Epinephrine NSAIDs, medications for pain and inflammation, such as ibuprofen or naproxen  Warfarin Other medications may affect the way this medication works. Talk with your care team about all of the medications you take. They may suggest changes to your treatment plan to lower the risk of side effects and to make sure your medications work as intended. This list may not describe all possible interactions. Give your health care provider a list of all the medicines, herbs, non-prescription drugs, or dietary supplements you use. Also tell them if you smoke, drink alcohol, or use illegal drugs. Some items may interact with your medicine. What should I watch for while using this medication? Visit your care team for regular checks on your progress. Check your blood pressure as directed. Know what your blood pressure should be and when to contact your care team. This medication may affect your coordination, reaction time, or judgment. Do not drive or operate machinery until you know how this medication affects you. Sit up or stand slowly to reduce the risk of dizzy or fainting spells. Drinking alcohol with this medication can  increase the risk of these side effects. Do not suddenly stop taking this medication. This may increase your risk of side effects, such as chest pain and heart attack. If you no longer need to take this medication,  your care team will lower the dose slowly over time to decrease the risk of side effects. If you are going to need surgery or a procedure, tell your care team that you are using this medication. This medication may affect blood glucose levels. It can also mask the symptoms of low blood sugar, such as a rapid heartbeat and tremors. If you have diabetes, it is important to check your blood sugar often while you are taking this medication. Do not treat yourself for coughs, colds, or pain while you are using this medication without asking your care team for advice. Some medications may increase your blood pressure. What side effects may I notice from receiving this medication? Side effects that you should report to your care team as soon as possible: Allergic reactions--skin rash, itching, hives, swelling of the face, lips, tongue, or throat Heart failure--shortness of breath, swelling of the ankles, feet, or hands, sudden weight gain, unusual weakness or fatigue Low blood pressure--dizziness, feeling faint or lightheaded, blurry vision Raynaud's--cool, numb, or painful fingers or toes that may change color from pale, to blue, to red Redness, blistering, peeling, or loosening of the skin, including inside the mouth Slow heartbeat--dizziness, feeling faint or lightheaded, confusion, trouble breathing, unusual weakness or fatigue Worsening mood, feelings of depression Side effects that usually do not require medical attention (report to your care team if they continue or are bothersome): Change in sex drive or performance Diarrhea Dizziness Fatigue Headache This list may not describe all possible side effects. Call your doctor for medical advice about side effects. You may report side effects to FDA at 1-800-FDA-1088. Where should I keep my medication? Keep out of the reach of children and pets. Store at room temperature between 20 and 25 degrees C (68 and 77 degrees F). Protect from light. Throw  away any unused medication after the expiration date. NOTE: This sheet is a summary. It may not cover all possible information. If you have questions about this medicine, talk to your doctor, pharmacist, or health care provider.  2024 Elsevier/Gold Standard (2022-03-29 00:00:00)

## 2023-08-23 NOTE — Progress Notes (Signed)
 [facility]    Provider:  Dr Tresia Fruit Requesting Provider: Roosvelt Colla, MD Primary Care Provider:  Roosvelt Colla, MD  CC:  migraines  HPI:  Megan Torres is a 53 y.o. female here as requested by Roosvelt Colla, MD for migraines. has Vitamin D  deficiency; Hypothyroidism; Infertility, female; Chronic migraine without aura with status migrainosus, not intractable; Other fatigue; Impaired fasting glucose; and Mixed hyperlipidemia on their problem list.  Migraines are unilateral on the top of the head, pulsating pounding throbbing, light and sound sensitivity, nausea, vomiting, even the slightest of smells may bother her or make it worse, she has tried Imitrex  and Zofran  and Ubrelvy  in the past, she has had partial response from the Imitrex  in the past, in the past her migraines have been triggered by menses, she has had to go to urgent care in the past in the ED for toradol injections. She states she has tried sumatriptan  since 2018 when she had her first episode. In the setting of stress getting her masters program and the episodes started at that time and since then stress can trigger, she has woken up with migraines feeling like she is going to vomit. The morning can happen mostly on a weekend (stress letdown headache), nurtec and ubrelvy  samples were tried. She has ubrelvy . She has 1-2 days a month of moderate to severe migraines and a 2-3 days of milder migraines. She has several other days of headaches triggered by dehydration and light she carries water. She has not had menses in the last several months so in pre-menopause and when she does get her migraine she gets a mild headache. She has myofascial shoulder pain. No vision changes, no numbness or tingling, not positional, not exertional, no vision changes, No other focal neurologic deficits, associated symptoms, inciting events or modifiable factors.   Medications tried that can be used in migraine/headache management greater  than 3 months include: Lifestyle modification, headache diaries, better sleep hygiene, exercise, management of migraine triggers, various analgesics and nsaids: Celebrex , meloxicam , Naprosyn , Zofran , prednisone tablets, Imitrex , tramadol , Ubrelvy ,Nurtec (both nurtec and ubrelvy  helpful)    Reviewed notes, labs and imaging from outside physicians, which showed:  01/19/2017: CLINICAL DATA:  Severe headache beginning today. Nausea and vomiting.   EXAM: CT HEAD WITHOUT CONTRAST   TECHNIQUE: Contiguous axial images were obtained from the base of the skull through the vertex without intravenous contrast.   COMPARISON:  None.   FINDINGS: Brain: No evidence of malformation, atrophy, old or acute small or large vessel infarction, mass lesion, hemorrhage, hydrocephalus or extra-axial collection. No evidence of pituitary lesion.   Vascular: No vascular calcification.  No hyperdense vessels.   Skull: Normal.  No fracture or focal bone lesion.   Sinuses/Orbits: Visualized sinuses are clear. No fluid in the middle ears or mastoids. Visualized orbits are normal.   Other: None significant   IMPRESSION: Normal head CT     Latest Ref Rng & Units 10/30/2021    8:24 AM 10/22/2020    8:30 AM 05/14/2020    9:24 AM  CBC  WBC 3.4 - 10.8 x10E3/uL 5.9  7.2  8.8   Hemoglobin 11.1 - 15.9 g/dL 16.1  09.6  04.5   Hematocrit 34.0 - 46.6 % 38.6  38.9  38.5   Platelets 150 - 450 x10E3/uL 282  328  248       Latest Ref Rng & Units 02/07/2023    8:18 AM 11/03/2022    8:05 AM 10/30/2021    8:24  AM  CMP  Glucose 70 - 99 mg/dL 409  811  914   BUN 6 - 24 mg/dL   10   Creatinine 7.82 - 1.00 mg/dL   9.56   Sodium 213 - 086 mmol/L   138   Potassium 3.5 - 5.2 mmol/L   4.3   Chloride 96 - 106 mmol/L   106   CO2 20 - 29 mmol/L   18   Calcium 8.7 - 10.2 mg/dL   9.1   Total Protein 6.0 - 8.5 g/dL   6.8   Total Bilirubin 0.0 - 1.2 mg/dL   0.6   Alkaline Phos 44 - 121 IU/L   80   AST 0 - 40 IU/L   16   ALT  0 - 32 IU/L   15      Review of Systems: Patient complains of symptoms per HPI as well as the following symptoms normal. Pertinent negatives and positives per HPI. All others negative.   Social History   Socioeconomic History   Marital status: Married    Spouse name: Designer, multimedia   Number of children: Not on file   Years of education: Not on file   Highest education level: Not on file  Occupational History   Not on file  Tobacco Use   Smoking status: Never   Smokeless tobacco: Never  Vaping Use   Vaping status: Never Used  Substance and Sexual Activity   Alcohol use: Not Currently   Drug use: No   Sexual activity: Not Currently    Partners: Male    Birth control/protection: None    Comment: lack of interest on both parts since getting COVID multiple times  Other Topics Concern   Not on file  Social History Narrative   Immigrated from [place] in 2003. Divorced and re-married.  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    Got her Masters in Audiological scientist at [employer] (1 year program, graduated 08/2017)      Lives with husband, 1 dog ([place], [place].   Worked at [employer], working as Product/process development scientist, in [place].   Left 12/2020.   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  Has stand-up desk at work.  Walks up 7 floors, down 15 floors at work.      Updated 10/2022   Caffiene 1 cup daily.    Working: [employer] with husband, 1 dog.    Social Drivers of Corporate investment banker Strain: Not on file  Food Insecurity: Not on file  Transportation Needs: Not on file  Physical Activity: Not on file  Stress: Not on file  Social Connections: Not on file  Intimate Partner Violence: Not on file    Family History  Problem Relation Age of Onset   Breast cancer Mother 18   Hypothyroidism Mother    Headache Sister        menstral cycle   Ovarian cancer Maternal Aunt     Diabetes Neg Hx    Heart disease Neg Hx    Colon cancer Neg Hx    Rectal cancer Neg Hx    Stomach cancer Neg Hx     Past Medical History:  Diagnosis Date   Allergy    Family history of breast cancer in mother    Family history of breast cancer in mother    she was never tested for BRCA   Family history of  ovarian cancer    Thyroid  disease    HYPOTHYROID, h/o hyperthyroidism.  off meds since 8/09  (Dr. Hubert Madden)   Vitamin D  deficiency 2010    Patient Active Problem List   Diagnosis Date Noted   Impaired fasting glucose 11/08/2022   Mixed hyperlipidemia 11/08/2022   Chronic migraine without aura with status migrainosus, not intractable 09/12/2020   Other fatigue 09/12/2020   Infertility, female 09/29/2016   Hypothyroidism 04/20/2012   Vitamin D  deficiency 01/01/2011    Past Surgical History:  Procedure Laterality Date   LIPOMA EXCISION  1/08   fatty tumor removed from L axilla (in [place])    Current Outpatient Medications  Medication Sig Dispense Refill   meloxicam  (MOBIC ) 15 MG tablet Take 1 tablet (15 mg total) by mouth daily. Take with food, daily, until your pain has resolved. 15 tablet 0   ondansetron  (ZOFRAN -ODT) 8 MG disintegrating tablet Take 1 tablet (8 mg total) by mouth every 8 (eight) hours as needed. 20 tablet 11   propranolol (INDERAL) 20 MG tablet Take 1 tablet (20 mg total) by mouth 2 (two) times daily. 60 tablet 6   vitamin B-12 (CYANOCOBALAMIN) 500 MCG tablet Take 500 mcg by mouth daily.     VITAMIN D  PO Take 5,000 Units by mouth.     SUMAtriptan  (IMITREX ) 25 MG tablet Take 1 tablet (25 mg total) by mouth daily. 30 tablet 0   Ubrogepant  (UBRELVY ) 100 MG TABS Take 1 tablet (100 mg total) by mouth daily as needed (migraine). May repeat the dose once in 2 hours, if needed (max 200 mg/24 hours) (Patient not taking: Reported on 08/23/2023) 15 tablet 0   Ubrogepant  (UBRELVY ) 50 MG TABS Take 1-2 tablets (50-100 mg total) by mouth daily as needed (migraine). You may  repeat the dose in 2 hours if needed (max 200 mg/24 hour period) (Patient not taking: Reported on 08/23/2023)     No current facility-administered medications for this visit.    Allergies as of 08/23/2023   (No Known Allergies)    Vitals: BP (!) 170/96 (BP Location: Right Arm, Patient Position: Sitting, Cuff Size: Normal) Comment: manual  Pulse 70   Ht 5\' 4"  (1.626 m)   Wt 151 lb 12.8 oz (68.9 kg)   SpO2 97%   BMI 26.06 kg/m  Last Weight:  Wt Readings from Last 1 Encounters:  08/23/23 151 lb 12.8 oz (68.9 kg)   Last Height:   Ht Readings from Last 1 Encounters:  08/23/23 5\' 4"  (1.626 m)     Physical exam: Exam: Gen: NAD, conversant, well nourised, well groomed                     CV: RRR, no MRG. No Carotid Bruits. No peripheral edema, warm, nontender Eyes: Conjunctivae clear without exudates or hemorrhage  Neuro: Detailed Neurologic Exam  Speech:    Speech is normal; fluent and spontaneous with normal comprehension.  Cognition:    The patient is oriented to person, place, and time;     recent and remote memory intact;     language fluent;     normal attention, concentration,     fund of knowledge Cranial Nerves:    The pupils are equal, round, and reactive to light. The fundi are normal and spontaneous venous pulsations are present. Visual fields are full to finger confrontation. Extraocular movements are intact. Trigeminal sensation is intact and the muscles of mastication are normal. The face is symmetric. The palate elevates in the  midline. Hearing intact. Voice is normal. Shoulder shrug is normal. The tongue has normal motion without fasciculations.   Coordination: nml  Gait: nml  Motor Observation:    No asymmetry, no atrophy, and no involuntary movements noted. Tone:    Normal muscle tone.    Posture:    Posture is normal. normal erect    Strength:    Strength is V/V in the upper and lower limbs.      Sensation: intact to LT     Reflex  Exam:  DTR's:    Deep tendon reflexes in the upper and lower extremities are normal bilaterally.   Toes:    The toes are downgoing bilaterally.   Clonus:    Clonus is absent.    Assessment/Plan:  Patient with episodic migraines. No red flags to indicated imaging needed however threshold low. Neuro exam normal.  Prevention: Start Propranolol which may help with migraines and blood pressure, also has some anti-anxiety effects. Also consider in the future Topiramate, nortriptyline. Other options include Ajovy, Emgality, Qulipta and these are new migraine preventatives also botox for migraines  Acute: Ubrelvy  at onset can take it  with ondansetron  (has ubrelvy  prescripton already)  Cervical myofascial pain: Just starting Mobic (Meloxicam ) at bedtime. Can also try muscle relaxers in the future  Light sensitivity: Fl41 tinting(theraspecs), Car tinting, green light therapy for migraines  Meds ordered this encounter  Medications   propranolol (INDERAL) 20 MG tablet    Sig: Take 1 tablet (20 mg total) by mouth 2 (two) times daily.    Dispense:  60 tablet    Refill:  6   ondansetron  (ZOFRAN -ODT) 8 MG disintegrating tablet    Sig: Take 1 tablet (8 mg total) by mouth every 8 (eight) hours as needed.    Dispense:  20 tablet    Refill:  11    Cc: Roosvelt Colla, MD,  Roosvelt Colla, MD  Aldona Amel, MD  [facility] [address] [place], [place] [address]  Phone [phone] Fax [phone]

## 2023-10-04 ENCOUNTER — Other Ambulatory Visit: Payer: Self-pay | Admitting: Family Medicine

## 2023-10-04 DIAGNOSIS — Z1231 Encounter for screening mammogram for malignant neoplasm of breast: Secondary | ICD-10-CM

## 2023-10-21 ENCOUNTER — Encounter: Payer: Self-pay | Admitting: Family Medicine

## 2023-10-21 ENCOUNTER — Ambulatory Visit (INDEPENDENT_AMBULATORY_CARE_PROVIDER_SITE_OTHER): Admitting: Medical

## 2023-10-21 VITALS — BP 124/82 | HR 84 | Temp 97.8°F | Wt 153.0 lb

## 2023-10-21 DIAGNOSIS — R059 Cough, unspecified: Secondary | ICD-10-CM

## 2023-10-21 DIAGNOSIS — R0982 Postnasal drip: Secondary | ICD-10-CM

## 2023-10-21 MED ORDER — HYDROCOD POLI-CHLORPHE POLI ER 10-8 MG/5ML PO SUER: 5.0000 mL | Freq: Two times a day (BID) | ORAL | 0 refills | Status: DC

## 2023-10-21 NOTE — Progress Notes (Signed)
 Subjective:  Megan Torres is a 52 y.o. female who presents for Chief Complaint  Patient presents with   other    Cough, fever, started with ST on Monday, coughing so bad could not sleep last night happens more when laying down, taking dayquil and nyquil, had bad body pain Monday and Tuesday, and was very hot then,      Here for cough and symptoms.  Sore throat started 5 days ago, but cough since 2 days ago.   Had some fever one day, 101, felt really lethargic and faint 4 days ago.  No runny nose, no sneezing.  Having itching in throat.  No ear pain.  No belching, no indigestion.  Has had a few limited times of mucous production.  Is having to clear throat some.   No sick contacts.   No SOB or wheezing.  Using dayfile and Nyquil.  No other aggravating or relieving factors.    No other c/o.  Past Medical History:  Diagnosis Date   Allergy    Family history of breast cancer in mother    Family history of breast cancer in mother    she was never tested for BRCA   Family history of ovarian cancer    Thyroid  disease    HYPOTHYROID, h/o hyperthyroidism.  off meds since 8/09  (Dr. Von)   Vitamin D  deficiency 2010   Current Outpatient Medications on File Prior to Visit  Medication Sig Dispense Refill   meloxicam  (MOBIC ) 15 MG tablet Take 1 tablet (15 mg total) by mouth daily. Take with food, daily, until your pain has resolved. (Patient taking differently: Take 15 mg by mouth daily. Prn Take with food, daily, until your pain has resolved.) 15 tablet 0   Omega-3 Fatty Acids (FISH OIL) 500 MG CAPS Take by mouth.     ondansetron  (ZOFRAN -ODT) 8 MG disintegrating tablet Take 1 tablet (8 mg total) by mouth every 8 (eight) hours as needed. 20 tablet 11   propranolol  (INDERAL ) 20 MG tablet Take 1 tablet (20 mg total) by mouth 2 (two) times daily. 60 tablet 6   Ubrogepant  (UBRELVY ) 100 MG TABS Take 1 tablet (100 mg total) by mouth daily as needed (migraine). May repeat the dose once in 2 hours, if  needed (max 200 mg/24 hours) 15 tablet 0   Ubrogepant  (UBRELVY ) 50 MG TABS Take 1-2 tablets (50-100 mg total) by mouth daily as needed (migraine). You may repeat the dose in 2 hours if needed (max 200 mg/24 hour period)     vitamin B-12 (CYANOCOBALAMIN) 500 MCG tablet Take 500 mcg by mouth daily.     VITAMIN D  PO Take 5,000 Units by mouth.     SUMAtriptan  (IMITREX ) 25 MG tablet Take 1 tablet (25 mg total) by mouth daily. 30 tablet 0   No current facility-administered medications on file prior to visit.     The following portions of the patient's history were reviewed and updated as appropriate: allergies, current medications, past family history, past medical history, past social history, past surgical history and problem list.  ROS Otherwise as in subjective above    Objective: BP 124/82   Pulse 84   Temp 97.8 F (36.6 C)   Wt 153 lb (69.4 kg)   SpO2 98%   BMI 26.26 kg/m   General appearance: alert, no distress, well developed, well nourished HEENT: normocephalic, sclerae anicteric, conjunctiva pink and moist, TMs flat, nares patent, no discharge or erythema, pharynx with post nasal drainage Oral  cavity: MMM, no lesions Neck: supple, no lymphadenopathy, no thyromegaly, no masses Heart: RRR, normal S1, S2, no murmurs Lungs: CTA bilaterally, no wheezes, rhonchi, or rales    Assessment: Encounter Diagnoses  Name Primary?   Cough, unspecified type Yes   Post-nasal drainage      Plan: We discussed symptoms and findings.  Begin nasal saline and salt water gargles a couple times a day, begin Tussionex once or twice daily, can continue 3-5 more days of Mucinex, good water intake, Tylenol as needed for pain.  Symptoms suggest viral etiology.  If not much improved within the next 3 to 5 days call or recheck  Megan Torres was seen today for other.  Diagnoses and all orders for this visit:  Cough, unspecified type  Post-nasal drainage  Other orders -      chlorpheniramine-HYDROcodone (TUSSIONEX) 10-8 MG/5ML; Take 5 mLs by mouth 2 (two) times daily.    Follow up: prn

## 2023-10-21 NOTE — Telephone Encounter (Signed)
 Pt scheduled for today.

## 2023-11-03 ENCOUNTER — Ambulatory Visit
Admission: RE | Admit: 2023-11-03 | Discharge: 2023-11-03 | Disposition: A | Discharge status: Home / Self Care | Source: Ambulatory Visit | Attending: Family Medicine | Admitting: Family Medicine

## 2023-11-03 DIAGNOSIS — Z1231 Encounter for screening mammogram for malignant neoplasm of breast: Secondary | ICD-10-CM

## 2023-11-07 ENCOUNTER — Encounter: Payer: Self-pay | Admitting: Family Medicine

## 2023-11-08 ENCOUNTER — Other Ambulatory Visit: Payer: Self-pay | Admitting: *Deleted

## 2023-11-08 ENCOUNTER — Telehealth: Payer: Self-pay | Admitting: Family Medicine

## 2023-11-08 NOTE — Telephone Encounter (Signed)
 SABRA

## 2023-11-10 ENCOUNTER — Encounter: Payer: BC Managed Care – PPO | Admitting: Family Medicine

## 2023-12-07 ENCOUNTER — Telehealth: Payer: Self-pay | Admitting: Neurology

## 2023-12-07 NOTE — Telephone Encounter (Signed)
 LVM and sent mychart msg informing pt of appt change - MD schedule change

## 2023-12-19 ENCOUNTER — Other Ambulatory Visit

## 2023-12-19 DIAGNOSIS — Z5181 Encounter for therapeutic drug level monitoring: Secondary | ICD-10-CM

## 2023-12-19 DIAGNOSIS — E039 Hypothyroidism, unspecified: Secondary | ICD-10-CM

## 2023-12-19 DIAGNOSIS — R030 Elevated blood-pressure reading, without diagnosis of hypertension: Secondary | ICD-10-CM

## 2023-12-19 DIAGNOSIS — E782 Mixed hyperlipidemia: Secondary | ICD-10-CM

## 2023-12-19 DIAGNOSIS — E559 Vitamin D deficiency, unspecified: Secondary | ICD-10-CM

## 2023-12-19 DIAGNOSIS — R7301 Impaired fasting glucose: Secondary | ICD-10-CM

## 2023-12-20 LAB — TSH: TSH: 5.36 u[IU]/mL — ABNORMAL HIGH (ref 0.450–4.500)

## 2023-12-20 LAB — COMPREHENSIVE METABOLIC PANEL WITH GFR
ALT: 15 IU/L (ref 0–32)
AST: 13 IU/L (ref 0–40)
Albumin: 4.3 g/dL (ref 3.8–4.9)
Alkaline Phosphatase: 94 IU/L (ref 44–121)
BUN/Creatinine Ratio: 17 (ref 9–23)
BUN: 12 mg/dL (ref 6–24)
Bilirubin Total: 0.4 mg/dL (ref 0.0–1.2)
CO2: 19 mmol/L — ABNORMAL LOW (ref 20–29)
Calcium: 9.1 mg/dL (ref 8.7–10.2)
Chloride: 107 mmol/L — ABNORMAL HIGH (ref 96–106)
Creatinine, Ser: 0.69 mg/dL (ref 0.57–1.00)
Globulin, Total: 2.4 g/dL (ref 1.5–4.5)
Glucose: 121 mg/dL — ABNORMAL HIGH (ref 70–99)
Potassium: 4.2 mmol/L (ref 3.5–5.2)
Sodium: 139 mmol/L (ref 134–144)
Total Protein: 6.7 g/dL (ref 6.0–8.5)
eGFR: 104 mL/min/1.73 (ref >59)

## 2023-12-20 LAB — LIPID PANEL
Chol/HDL Ratio: 5.3 ratio — ABNORMAL HIGH (ref 0.0–4.4)
Cholesterol, Total: 189 mg/dL (ref 100–199)
HDL: 36 mg/dL — ABNORMAL LOW (ref >39)
LDL Chol Calc (NIH): 125 mg/dL — ABNORMAL HIGH (ref 0–99)
Triglycerides: 157 mg/dL — ABNORMAL HIGH (ref 0–149)
VLDL Cholesterol Cal: 28 mg/dL (ref 5–40)

## 2023-12-20 LAB — HEMOGLOBIN A1C
Est. average glucose Bld gHb Est-mCnc: 117 mg/dL
Hgb A1c MFr Bld: 5.7 % — ABNORMAL HIGH (ref 4.8–5.6)

## 2023-12-20 LAB — VITAMIN D 25 HYDROXY (VIT D DEFICIENCY, FRACTURES): Vit D, 25-Hydroxy: 24.5 ng/mL — AB (ref 30.0–100.0)

## 2023-12-21 NOTE — Progress Notes (Unsigned)
 No chief complaint on file.  Megan Torres is a 53 y.o. female who presents for a complete physical and follow-up on chronic problems See below for labs done prior to her visit.  Vitamin D  deficiency:  She has had low levels for years.  Level was normal in 01/2023 after having just taken a prescription course.  Prior to that she had been taking 5000 IU about 3-4 times/week.  She is currently taking ***  Component Ref Range & Units (hover) 2 d ago (12/19/23) 10 mo ago (02/07/23) 1 yr ago (11/03/22) 2 yr ago (10/30/21) 3 yr ago (10/22/20) 3 yr ago (05/14/20) 4 yr ago (10/24/19)  Vit D, 25-Hydroxy 24.5 Low  40.5 CM 16.6 Low  CM 20.4 Low  CM 18.7 Low  CM 20.7 Low  CM 20.6    She has h/o hypothyroidism--had been off meds for many years (stopped 2011), previously monitored by Dr. Von. (Started back on meds during evaluation for infertility, for 6 months, stopped in 12/2015, and took it one other time for a bit when she was having more hair loss).  TSH has remained mildly elevated, but asymptomatic and she has declined treatment (subclinical hypothyroidism).  She has h/o +TPO antibodies.   She denies any fatigue. No changes to hair/skin/nails/bowels. Occasionally has some hair loss, but doesn't persist. Her feet sometimes still got hot.  She is better tolerant of being in the cold (previously couldn't tolerate cold temps).  Menses are very irregular.  ***  Denies hot flashes or night sweats (just hot feet). TSH remains stable (5-6 range).   Component Ref Range & Units (hover) 2 d ago (12/19/23) 1 yr ago (11/03/22) 2 yr ago (10/30/21) 3 yr ago (10/22/20) 3 yr ago (05/14/20) 4 yr ago (10/24/19) 5 yr ago (10/12/18)  TSH 5.360 High  6.090 High  6.140 High  5.080 High  2.870 4.050 6.030    Impaired fasting glucose:  She has had elevated fasting sugars, max A1c of 6.4% in 10/2022. She tries to limit the sugar/sweets/carbs in her diet. She doesn't eat sweets.    Has rice only 1-2x/week. Not  eating out much. Eating more cottage cheese for protein. Eats mainly oats for breakfast. No longer has pasta.  Component Ref Range & Units (hover) 2 d ago 10 mo ago 1 yr ago 2 yr ago 3 yr ago 4 yr ago 9 yr ago  Hgb A1c MFr Bld 5.7 High  5.9 Abnormal  R 6.4 Abnormal  R 5.8 Abnormal  R 5.8 High  CM 5.8 High  CM 5.7 High     Dyslipidemia:   She continues to eat more cottage cheese. She still only eats egg whites. No creamy sauces/dressings. Still uses half and half in her one cup of coffee. No longer having much almond milk since she rarely has cereal. She continues to eat a lot of lentils, beans, but smaller portions. She remains vegetarian (very rare fish). She no longer has paneer, and occasionally has some cheese at work, but only once a week.  Component Ref Range & Units (hover) 2 d ago 10 mo ago 1 yr ago 2 yr ago 3 yr ago 4 yr ago 5 yr ago  Cholesterol, Total 189 193 216 High  179 172 188 172  Triglycerides 157 High  118 162 High  141 143 106 92  HDL 36 Low  39 Low  35 Low  32 Low  35 Low  45 42  VLDL Cholesterol Cal 28 21 30  25 26 19 18   LDL Chol Calc (NIH) 125 High  133 High  151 High  122 High  111 High  124 High    Chol/HDL Ratio 5.3 High  4.9 High  CM 6.2 High  CM 5.6 High  CM 4.9 High  CM 4.2 CM 4.1 CM    Migraines:  She saw Dr. Ines in 08/2023. She was started on propranolol  (BP also very high at that visit), to help with migraines, BP, and for anti-anxiety effect. (Per Dr. Sharion notes,  consider in the future Topiramate, nortriptyline. Other options include Ajovy, Emgality, Qulipta, Botox). To take Ubrelvy  at onset of migraine, with ondansetron .  Meloxicam  for cervical myofascial pain, consider muscle relaxers in future. She recommended tinted glasses, car tinting, green light therapy for migraines. She has f/u scheduled 10/10.   Elevated BP's--noted at visits periodically.  She was started on propranolol  for migraines by neuro, and BP was noted to be better at an  acute visit in July. She tries to limit sodium in her diet. Denies headaches, chest pain, dizziness.  BP's are running ***  BP Readings from Last 3 Encounters:  10/21/23 124/82  08/23/23 (!) 170/96  05/05/23 (!) 140/92    Immunization History  Administered Date(s) Administered   Influenza Split 02/01/2014   Influenza, Seasonal, Injecte, Preservative Fre 06/08/2012   Influenza,inj,Quad PF,6+ Mos 12/02/2016, 12/19/2019   Influenza-Unspecified 04/20/2022, 12/26/2022   PFIZER(Purple Top)SARS-COV-2 Vaccination 06/20/2019, 07/11/2019, 04/03/2020   Pfizer(Comirnaty)Fall Seasonal Vaccine 12 years and older 04/18/2023   Tdap 06/08/2012, 09/27/2016, 03/31/2017   Zoster Recombinant(Shingrix ) 11/04/2021, 06/10/2022   MMR immune. Varicella previously found to be immune. Last Pap smear: 11/2021, normal, no high risk HPV present Last mammogram: 10/2023 Last colonoscopy: 12/2019, 1 tubular adenoma; 7 year f/u recommended Last DEXA: never Dentist: every 3 months  Ophtho: *** Exercise:   Usual routine--walking 3-4 miles, 2-3 days/week  Uses 5# weights sporadically. Yoga 3x/week. Has a gym at [employer] and treadmill.   PMH, PSH, SH and FH were reviewed and updated     ROS:  The patient denies anorexia, fever, vision changes, decreased hearing, ear pain, sore throat, breast concerns, chest pain, palpitations, dizziness, syncope, dyspnea on exertion, cough, swelling, nausea, vomiting, diarrhea, constipation, abdominal pain, melena, hematochezia, indigestion/heartburn, hematuria, incontinence, dysuria, vaginal discharge, odor or itch, genital lesions, numbness, tingling, weakness, tremor, suspicious skin lesions, depression, anxiety, abnormal bleeding/bruising, or enlarged lymph nodes.  Migraines *** Cycles are irregular.  Only mild hot flashes, no night sweats.   PHYSICAL EXAM:  There were no vitals taken for this visit.  Wt Readings from Last 3 Encounters:  10/21/23 153 lb (69.4 kg)   08/23/23 151 lb 12.8 oz (68.9 kg)  02/10/23 151 lb 12.8 oz (68.9 kg)   General Appearance:     Alert, cooperative, no distress, appears stated age   Head:     Normocephalic, without obvious abnormality, atraumatic   Eyes:     PERRL, conjunctiva/corneas clear, EOM's intact, fundi benign   Ears:     Normal TM's and external ear canals   Nose:    Normal, no drainage or sinus tenderness  Throat:    Normal mucosa, no lesions  Neck:    Supple, no lymphadenopathy;  thyroid : borderline size, no  tenderness/nodules; no carotid bruit or JVD   Back:     Spine nontender, no curvature, ROM normal, no CVA tenderness   Lungs:      Clear to auscultation bilaterally without wheezes, rales or ronchi; respirations unlabored  Chest Wall:     No deformity, nontender   Heart:     Regular rate and rhythm, S1 and S2 normal, no murmur, rub or gallop   Breast Exam:    No nipple discharge, inversion, skin dimpling, breast masses or axillary lymphadenopathy.    Abdomen:      Soft, non-tender, nondistended, normoactive bowel sounds, no masses, no hepatosplenomegaly   Genitalia:    External genitalia is normal without lesions. BUS/vagina normal, no abnormal discharge.  Uterus is normal in size, no adnexal masses or tenderness.  No cervical motion tenderness. Pap not performed  Rectal:    Normal sphincter tone, no masses.  Heme negative stool in vault  Extremities:    No clubbing, cyanosis or edema.   Pulses:    2+ and symmetric all extremities   Skin:    Skin color, texture, turgor normal, no rashes or lesions   Lymph nodes:    Cervical, supraclavicular and axillary nodes normal   Neurologic:    Normal strength, sensation and gait; reflexes 2+ and symmetric throughout                     Psych:   Normal mood, affect, hygiene and grooming   Lab Results  Component Value Date   HGBA1C 5.7 (H) 12/19/2023   Fasting glucose 121  Vitamin D -OH 24.5  Lab Results  Component Value Date   TSH 5.360 (H) 12/19/2023    Lab Results  Component Value Date   CHOL 189 12/19/2023   HDL 36 (L) 12/19/2023   LDLCALC 125 (H) 12/19/2023   TRIG 157 (H) 12/19/2023   CHOLHDL 5.3 (H) 12/19/2023    Flowsheet Row Office Visit from 11/08/2022 in [facility]  PHQ-2 Total Score 0     ASSESSMENT/PLAN:  Flu, prevnar-20 COVID in 2 weeks from pharmacy, rx  Dyslipidemia--consider calcium score, discuss  Discussed monthly self breast exams and yearly mammograms; at least 30 minutes of aerobic activity at least 5 days/week, weight-bearing exercise 2x/wk; proper sunscreen use reviewed; healthy diet, including goals of calcium and vitamin D  intake and alcohol recommendations (less than or equal to 1 drink/day) reviewed; regular seatbelt use; changing batteries in smoke detectors.  Immunization recommendations discussed, UTD. Continue yearly flu shots*** Prevnar-20 *** COVID booster from the pharmacy in 2 weeks. Colon cancer screening is UTD, repeat colonoscopy due 2028.   F/u 1 year for CPE, sooner prn

## 2023-12-21 NOTE — Patient Instructions (Incomplete)

## 2023-12-22 ENCOUNTER — Encounter: Payer: Self-pay | Admitting: Family Medicine

## 2023-12-22 ENCOUNTER — Ambulatory Visit (INDEPENDENT_AMBULATORY_CARE_PROVIDER_SITE_OTHER): Admitting: Family Medicine

## 2023-12-22 ENCOUNTER — Telehealth: Admitting: Neurology

## 2023-12-22 VITALS — BP 124/74 | HR 68 | Ht 64.0 in | Wt 150.0 lb

## 2023-12-22 DIAGNOSIS — R7301 Impaired fasting glucose: Secondary | ICD-10-CM | POA: Diagnosis not present

## 2023-12-22 DIAGNOSIS — E782 Mixed hyperlipidemia: Secondary | ICD-10-CM

## 2023-12-22 DIAGNOSIS — E039 Hypothyroidism, unspecified: Secondary | ICD-10-CM | POA: Diagnosis not present

## 2023-12-22 DIAGNOSIS — E559 Vitamin D deficiency, unspecified: Secondary | ICD-10-CM

## 2023-12-22 DIAGNOSIS — Z0000 Encounter for general adult medical examination without abnormal findings: Secondary | ICD-10-CM | POA: Diagnosis not present

## 2023-12-22 DIAGNOSIS — Z23 Encounter for immunization: Secondary | ICD-10-CM

## 2023-12-22 MED ORDER — VITAMIN D (ERGOCALCIFEROL) 1.25 MG (50000 UNIT) PO CAPS: 50000.0000 [IU] | ORAL_CAPSULE | ORAL | 0 refills | Status: AC

## 2023-12-22 MED ORDER — MNEXSPIKE 10 MCG/0.2ML IM SUSY: 0.2000 mL | PREFILLED_SYRINGE | Freq: Once | INTRAMUSCULAR | 0 refills | Status: AC

## 2023-12-23 ENCOUNTER — Encounter: Payer: Self-pay | Admitting: Family Medicine

## 2023-12-23 MED ORDER — LEVOTHYROXINE SODIUM 25 MCG PO TABS
25.0000 ug | ORAL_TABLET | Freq: Every day | ORAL | Status: DC
Start: 2023-12-23 — End: 2024-01-16

## 2024-01-12 ENCOUNTER — Ambulatory Visit (HOSPITAL_BASED_OUTPATIENT_CLINIC_OR_DEPARTMENT_OTHER)
Admission: RE | Admit: 2024-01-12 | Discharge: 2024-01-12 | Disposition: A | Discharge status: Home / Self Care | Payer: Self-pay | Source: Ambulatory Visit | Attending: Family Medicine | Admitting: Family Medicine

## 2024-01-12 DIAGNOSIS — E782 Mixed hyperlipidemia: Secondary | ICD-10-CM | POA: Insufficient documentation

## 2024-01-13 ENCOUNTER — Ambulatory Visit: Payer: Self-pay | Admitting: Family Medicine

## 2024-01-16 ENCOUNTER — Other Ambulatory Visit: Payer: Self-pay | Admitting: *Deleted

## 2024-01-16 DIAGNOSIS — E039 Hypothyroidism, unspecified: Secondary | ICD-10-CM

## 2024-01-16 MED ORDER — SYNTHROID 25 MCG PO TABS: 25.0000 ug | ORAL_TABLET | Freq: Every day | ORAL | 0 refills | Status: DC

## 2024-01-20 ENCOUNTER — Telehealth: Admitting: Neurology

## 2024-01-24 NOTE — Progress Notes (Unsigned)
 No chief complaint on file.   HISTORY OF PRESENT ILLNESS:  01/24/24 ALL:  Megan Torres is a 53 y.o. female here today for follow up for migraines. She was seen in consult with Dr Ines 08/2023 and started on propranolol . Ubrelvy  continued for abortive therapy.   Since,   Ubrelvy  Ondansetron     HISTORY (copied from Dr Sharion previous note)  HPI:  Megan Torres is a 53 y.o. female here as requested by Randol Dawes, MD for migraines. has Vitamin D  deficiency; Hypothyroidism; Infertility, female; Chronic migraine without aura with status migrainosus, not intractable; Other fatigue; Impaired fasting glucose; and Mixed hyperlipidemia on their problem list.   Migraines are unilateral on the top of the head, pulsating pounding throbbing, light and sound sensitivity, nausea, vomiting, even the slightest of smells may bother her or make it worse, she has tried Imitrex  and Zofran  and Ubrelvy  in the past, she has had partial response from the Imitrex  in the past, in the past her migraines have been triggered by menses, she has had to go to urgent care in the past in the ED for toradol injections. She states she has tried sumatriptan  since 2018 when she had her first episode. In the setting of stress getting her masters program and the episodes started at that time and since then stress can trigger, she has woken up with migraines feeling like she is going to vomit. The morning can happen mostly on a weekend (stress letdown headache), nurtec and ubrelvy  samples were tried. She has ubrelvy . She has 1-2 days a month of moderate to severe migraines and a 2-3 days of milder migraines. She has several other days of headaches triggered by dehydration and light she carries water. She has not had menses in the last several months so in pre-menopause and when she does get her migraine she gets a mild headache. She has myofascial shoulder pain. No vision changes, no numbness or tingling, not positional,  not exertional, no vision changes, No other focal neurologic deficits, associated symptoms, inciting events or modifiable factors.     Medications tried that can be used in migraine/headache management greater than 3 months include: Lifestyle modification, headache diaries, better sleep hygiene, exercise, management of migraine triggers, various analgesics and nsaids: Celebrex , meloxicam , Naprosyn , Zofran , prednisone tablets, Imitrex , tramadol , Ubrelvy ,Nurtec (both nurtec and ubrelvy  helpful)   REVIEW OF SYSTEMS: Out of a complete 14 system review of symptoms, the patient complains only of the following symptoms, and all other reviewed systems are negative.   ALLERGIES: No Known Allergies   HOME MEDICATIONS: Outpatient Medications Prior to Visit  Medication Sig Dispense Refill   meloxicam  (MOBIC ) 15 MG tablet Take 1 tablet (15 mg total) by mouth daily. Take with food, daily, until your pain has resolved. 15 tablet 0   Omega-3 Fatty Acids (FISH OIL) 500 MG CAPS Take by mouth.     ondansetron  (ZOFRAN -ODT) 8 MG disintegrating tablet Take 1 tablet (8 mg total) by mouth every 8 (eight) hours as needed. 20 tablet 11   propranolol  (INDERAL ) 20 MG tablet Take 1 tablet (20 mg total) by mouth 2 (two) times daily. 60 tablet 6   SYNTHROID  25 MCG tablet Take 1 tablet (25 mcg total) by mouth daily before breakfast. 30 tablet 0   Ubrogepant  (UBRELVY ) 100 MG TABS Take 1 tablet (100 mg total) by mouth daily as needed (migraine). May repeat the dose once in 2 hours, if needed (max 200 mg/24 hours) 15 tablet 0  vitamin B-12 (CYANOCOBALAMIN) 500 MCG tablet Take 500 mcg by mouth daily.     VITAMIN D  PO Take 5,000 Units by mouth.     Vitamin D , Ergocalciferol , (DRISDOL ) 1.25 MG (50000 UNIT) CAPS capsule Take 1 capsule (50,000 Units total) by mouth every 7 (seven) days. 12 capsule 0   No facility-administered medications prior to visit.     PAST MEDICAL HISTORY: Past Medical History:  Diagnosis Date    Allergy    Family history of breast cancer in mother    she was never tested for BRCA   Family history of ovarian cancer    Thyroid  disease    HYPOTHYROID, h/o hyperthyroidism.  off meds since 8/09  (Dr. Von)   Vitamin D  deficiency 2010     PAST SURGICAL HISTORY: Past Surgical History:  Procedure Laterality Date   LIPOMA EXCISION  1/08   fatty tumor removed from L axilla (in [place])     FAMILY HISTORY: Family History  Problem Relation Age of Onset   Breast cancer Mother 16   Hypothyroidism Mother    Headache Sister        menstral cycle   Ovarian cancer Maternal Aunt    Diabetes Neg Hx    Heart disease Neg Hx    Colon cancer Neg Hx    Rectal cancer Neg Hx    Stomach cancer Neg Hx      SOCIAL HISTORY: Social History   Socioeconomic History   Marital status: Married    Spouse name: Designer, multimedia   Number of children: Not on file   Years of education: Not on file   Highest education level: Master's degree (e.g., MA, MS, MEng, MEd, MSW, MBA)  Occupational History   Not on file  Tobacco Use   Smoking status: Never   Smokeless tobacco: Never  Vaping Use   Vaping status: Never Used  Substance and Sexual Activity   Alcohol use: Not Currently    Comment: very rare   Drug use: No   Sexual activity: Not Currently    Partners: Male    Birth control/protection: None    Comment: lack of interest on both parts since getting COVID multiple times  Other Topics Concern   Not on file  Social History Narrative   Immigrated from [place] in 2003. Divorced and re-married.  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    Got her Masters in Audiological scientist at [employer] (1 year program, graduated 08/2017)      Lives with husband Nixon, 1 dog ([place], Zola).   Worked at [employer], working as Product/process development scientist, in [place].   Left 12/2020.   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  Has stand-up  desk at work.  Walks up 7 floors, down 15 floors at work.      Updated 12/2023      Caffiene 1 cup daily.    Working: [employer] with husband, 1 dog.    Social Drivers of Corporate investment banker Strain: Low Risk  (10/21/2023)   Overall Financial Resource Strain (CARDIA)    Difficulty of Paying Living Expenses: Not very hard  Food Insecurity: Food Insecurity Present (10/21/2023)   Hunger Vital Sign    Worried About Running Out of Food in the Last Year: Never true    Ran Out of Food in the Last Year: Sometimes true  Transportation Needs: No Transportation Needs (10/21/2023)  PRAPARE - Administrator, Civil Service (Medical): No    Lack of Transportation (Non-Medical): No  Physical Activity: Insufficiently Active (10/21/2023)   Exercise Vital Sign    Days of Exercise per Week: 2 days    Minutes of Exercise per Session: 30 min  Stress: No Stress Concern Present (10/21/2023)   [employer] of Occupational Health - Occupational Stress Questionnaire    Feeling of Stress: Only a little  Social Connections: Moderately Isolated (10/21/2023)   Social Connection and Isolation Panel    Frequency of Communication with Friends and Family: Once a week    Frequency of Social Gatherings with Friends and Family: Once a week    Attends Religious Services: More than 4 times per year    Active Member of [employer] or Organizations: No    Attends Engineer, structural: Not on file    Marital Status: Married  Catering manager Violence: Not on file     PHYSICAL EXAM  There were no vitals filed for this visit. There is no height or weight on file to calculate BMI.  Generalized: Well developed, in no acute distress  Cardiology: normal rate and rhythm, no murmur auscultated  Respiratory: clear to auscultation bilaterally    Neurological examination  Mentation: Alert oriented to time, place, history taking. Follows all commands speech and language fluent Cranial nerve  II-XII: Pupils were equal round reactive to light. Extraocular movements were full, visual field were full on confrontational test. Facial sensation and strength were normal. Uvula tongue midline. Head turning and shoulder shrug  were normal and symmetric. Motor: The motor testing reveals 5 over 5 strength of all 4 extremities. Good symmetric motor tone is noted throughout.  Sensory: Sensory testing is intact to soft touch on all 4 extremities. No evidence of extinction is noted.  Coordination: Cerebellar testing reveals good finger-nose-finger and heel-to-shin bilaterally.  Gait and station: Gait is normal. Tandem gait is normal. Romberg is negative. No drift is seen.  Reflexes: Deep tendon reflexes are symmetric and normal bilaterally.    DIAGNOSTIC DATA (LABS, IMAGING, TESTING) - I reviewed patient records, labs, notes, testing and imaging myself where available.  Lab Results  Component Value Date   WBC 5.9 10/30/2021   HGB 12.9 10/30/2021   HCT 38.6 10/30/2021   MCV 83 10/30/2021   PLT 282 10/30/2021      Component Value Date/Time   NA 139 12/19/2023 0935   K 4.2 12/19/2023 0935   CL 107 (H) 12/19/2023 0935   CO2 19 (L) 12/19/2023 0935   GLUCOSE 121 (H) 12/19/2023 0935   GLUCOSE 101 (H) 09/30/2016 0754   BUN 12 12/19/2023 0935   CREATININE 0.69 12/19/2023 0935   CREATININE 0.58 09/30/2016 0754   CALCIUM 9.1 12/19/2023 0935   PROT 6.7 12/19/2023 0935   ALBUMIN 4.3 12/19/2023 0935   AST 13 12/19/2023 0935   ALT 15 12/19/2023 0935   ALKPHOS 94 12/19/2023 0935   BILITOT 0.4 12/19/2023 0935   GFRNONAA 109 10/24/2019 1008   GFRAA 126 10/24/2019 1008   Lab Results  Component Value Date   CHOL 189 12/19/2023   HDL 36 (L) 12/19/2023   LDLCALC 125 (H) 12/19/2023   TRIG 157 (H) 12/19/2023   CHOLHDL 5.3 (H) 12/19/2023   Lab Results  Component Value Date   HGBA1C 5.7 (H) 12/19/2023   No results found for: [id number] Lab Results  Component Value Date   TSH 5.360 (H)  12/19/2023  No data to display               No data to display           ASSESSMENT AND PLAN  53 y.o. year old female  has a past medical history of Allergy, Family history of breast cancer in mother, Family history of ovarian cancer, Thyroid  disease, and Vitamin D  deficiency (2010). here with    No diagnosis found.  Shaylynn N Albin ***.  Healthy lifestyle habits encouraged. *** will follow up with PCP as directed. *** will return to see me in ***, sooner if needed. *** verbalizes understanding and agreement with this plan.   No orders of the defined types were placed in this encounter.    No orders of the defined types were placed in this encounter.    Greig Forbes, MSN, FNP-C 01/24/2024, 3:51 PM  [facility] [address] [place], [place] [address] [phone]

## 2024-01-24 NOTE — Patient Instructions (Signed)

## 2024-01-26 ENCOUNTER — Encounter: Payer: Self-pay | Admitting: Family Medicine

## 2024-01-26 ENCOUNTER — Ambulatory Visit (INDEPENDENT_AMBULATORY_CARE_PROVIDER_SITE_OTHER): Admitting: Family Medicine

## 2024-01-26 VITALS — BP 125/90 | HR 83 | Ht 61.0 in | Wt 151.5 lb

## 2024-01-26 DIAGNOSIS — G43009 Migraine without aura, not intractable, without status migrainosus: Secondary | ICD-10-CM

## 2024-01-26 DIAGNOSIS — G43001 Migraine without aura, not intractable, with status migrainosus: Secondary | ICD-10-CM

## 2024-01-26 MED ORDER — UBRELVY 100 MG PO TABS
ORAL_TABLET | ORAL | 11 refills | Status: AC
Start: 2024-01-26 — End: ?

## 2024-01-26 MED ORDER — PROPRANOLOL HCL 20 MG PO TABS: 20.0000 mg | ORAL_TABLET | Freq: Two times a day (BID) | ORAL | 3 refills | Status: AC

## 2024-02-10 ENCOUNTER — Other Ambulatory Visit

## 2024-02-10 DIAGNOSIS — E039 Hypothyroidism, unspecified: Secondary | ICD-10-CM

## 2024-02-11 ENCOUNTER — Ambulatory Visit: Payer: Self-pay | Admitting: Family Medicine

## 2024-02-11 LAB — TSH: TSH: 4.44 u[IU]/mL (ref 0.450–4.500)

## 2024-02-14 ENCOUNTER — Other Ambulatory Visit: Payer: Self-pay | Admitting: *Deleted

## 2024-02-14 DIAGNOSIS — E039 Hypothyroidism, unspecified: Secondary | ICD-10-CM

## 2024-02-16 ENCOUNTER — Other Ambulatory Visit: Payer: Self-pay | Admitting: *Deleted

## 2024-02-16 DIAGNOSIS — E039 Hypothyroidism, unspecified: Secondary | ICD-10-CM

## 2024-02-16 MED ORDER — SYNTHROID 25 MCG PO TABS: 25.0000 ug | ORAL_TABLET | Freq: Every day | ORAL | 1 refills | Status: DC

## 2024-03-29 ENCOUNTER — Other Ambulatory Visit

## 2024-03-30 ENCOUNTER — Other Ambulatory Visit

## 2024-04-03 ENCOUNTER — Other Ambulatory Visit

## 2024-04-03 DIAGNOSIS — E039 Hypothyroidism, unspecified: Secondary | ICD-10-CM

## 2024-04-03 LAB — TSH: TSH: 4.43 u[IU]/mL (ref 0.450–4.500)

## 2024-04-04 ENCOUNTER — Ambulatory Visit: Payer: Self-pay | Admitting: Family Medicine

## 2024-04-04 ENCOUNTER — Encounter: Payer: Self-pay | Admitting: Family Medicine

## 2024-04-06 ENCOUNTER — Other Ambulatory Visit: Payer: Self-pay | Admitting: *Deleted

## 2024-04-06 ENCOUNTER — Telehealth: Payer: Self-pay | Admitting: *Deleted

## 2024-04-06 DIAGNOSIS — E039 Hypothyroidism, unspecified: Secondary | ICD-10-CM

## 2024-04-06 MED ORDER — SYNTHROID 50 MCG PO TABS: 50.0000 ug | ORAL_TABLET | Freq: Every day | ORAL | 1 refills | Status: AC

## 2024-04-06 NOTE — Telephone Encounter (Signed)
 Copied from CRM #[id number]. Topic: General - Other >> Apr 04, 2024  1:01 PM Lauren C wrote: Reason for CRM: Pt returning call to Lucienne JULIANNA Fuse, RMA. She says she would like to do the 50 mcg synthroid  and a lab order to recheck tsh.  Done.

## 2024-04-20 ENCOUNTER — Telehealth: Payer: Self-pay

## 2024-04-20 NOTE — Telephone Encounter (Signed)
 Copied from CRM [id number]. Topic: Clinical - Medication Question >> Apr 20, 2024  8:15 AM Laymon HERO wrote: Reason for CRM: Patient asking for a call back to go over thyroid  medicaiton

## 2024-04-23 NOTE — Telephone Encounter (Signed)
 Called patient and she said [facility] never filled her Synthroid . I called over there and asked them to fill for her.

## 2024-05-14 ENCOUNTER — Other Ambulatory Visit (HOSPITAL_COMMUNITY): Payer: Self-pay

## 2024-05-15 ENCOUNTER — Telehealth: Payer: Self-pay

## 2024-05-15 ENCOUNTER — Other Ambulatory Visit (HOSPITAL_COMMUNITY): Payer: Self-pay

## 2024-05-15 NOTE — Telephone Encounter (Signed)
 Pharmacy Patient Advocate Encounter   Received notification from [facility] that prior authorization for Ubrelvy  100 mg is required/requested.   Insurance verification completed.   The patient is insured through [employer].   Per test claim: PA required; PA submitted to above mentioned insurance via Latent Key/confirmation #/EOC [id number] Status is pending

## 2024-06-01 ENCOUNTER — Other Ambulatory Visit

## 2024-06-15 ENCOUNTER — Other Ambulatory Visit

## 2024-07-05 ENCOUNTER — Ambulatory Visit: Payer: Self-pay | Admitting: Family Medicine

## 2024-10-02 ENCOUNTER — Other Ambulatory Visit

## 2024-12-24 ENCOUNTER — Other Ambulatory Visit: Payer: Self-pay

## 2024-12-24 ENCOUNTER — Other Ambulatory Visit

## 2024-12-27 ENCOUNTER — Encounter: Payer: Self-pay | Admitting: Family Medicine

## 2025-02-05 ENCOUNTER — Ambulatory Visit: Admitting: Family Medicine
